# Patient Record
Sex: Female | Born: 1960 | Race: White | Hispanic: No | Smoking: Current every day smoker
Health system: Southern US, Community
[De-identification: ages and names within clinical notes are randomized; demographics above are authoritative.]

## PROBLEM LIST (undated history)

## (undated) DIAGNOSIS — M5430 Sciatica, unspecified side: Secondary | ICD-10-CM

## (undated) DIAGNOSIS — F419 Anxiety disorder, unspecified: Secondary | ICD-10-CM

## (undated) DIAGNOSIS — J9819 Other pulmonary collapse: Secondary | ICD-10-CM

## (undated) HISTORY — DX: Other pulmonary collapse: J98.19

## (undated) HISTORY — DX: Sciatica, unspecified side: M54.30

## (undated) HISTORY — DX: Anxiety disorder, unspecified: F41.9

---

## 2006-01-29 ENCOUNTER — Other Ambulatory Visit: Payer: Self-pay

## 2006-01-29 ENCOUNTER — Inpatient Hospital Stay: Payer: Self-pay | Admitting: Internal Medicine

## 2006-01-30 ENCOUNTER — Other Ambulatory Visit: Payer: Self-pay

## 2014-12-16 ENCOUNTER — Emergency Department: Payer: Self-pay | Admitting: Emergency Medicine

## 2016-01-15 ENCOUNTER — Ambulatory Visit (INDEPENDENT_AMBULATORY_CARE_PROVIDER_SITE_OTHER): Payer: Managed Care, Other (non HMO) | Admitting: Family Medicine

## 2016-01-15 ENCOUNTER — Encounter: Payer: Self-pay | Admitting: Family Medicine

## 2016-01-15 VITALS — BP 134/74 | HR 96 | Temp 98.3°F | Resp 16 | Ht 62.0 in | Wt 96.6 lb

## 2016-01-15 DIAGNOSIS — Z7189 Other specified counseling: Secondary | ICD-10-CM

## 2016-01-15 DIAGNOSIS — M5441 Lumbago with sciatica, right side: Secondary | ICD-10-CM

## 2016-01-15 DIAGNOSIS — Z7689 Persons encountering health services in other specified circumstances: Secondary | ICD-10-CM

## 2016-01-15 DIAGNOSIS — F419 Anxiety disorder, unspecified: Secondary | ICD-10-CM

## 2016-01-15 DIAGNOSIS — E785 Hyperlipidemia, unspecified: Secondary | ICD-10-CM

## 2016-01-15 DIAGNOSIS — M5440 Lumbago with sciatica, unspecified side: Secondary | ICD-10-CM | POA: Insufficient documentation

## 2016-01-15 MED ORDER — NAPROXEN 500 MG PO TABS: 500.0000 mg | ORAL_TABLET | Freq: Two times a day (BID) | ORAL | Status: DC

## 2016-01-15 MED ORDER — ALPRAZOLAM 0.5 MG PO TABS: 0.5000 mg | ORAL_TABLET | Freq: Two times a day (BID) | ORAL | Status: AC

## 2016-01-15 NOTE — Progress Notes (Signed)
Subjective:    Patient ID: Deborah Griffin, female    DOB: April 27, 1961, 55 y.o.   MRN: [id number]  HPI: Deborah Griffin is a 55 y.o. female presenting on 01/15/2016 for Establish Care   HPI  Pt presents to establish care today. Previous care provider was Dr. Paulino Rily.  It has been 3 months since Her last PCP visit. Records provided by patient and were reviewed. Current medical problems include:  Anxiety: Diagnosed 4 years ago. Takes Xanax 0.5mg  twice daily. Tried paxil in the past. She reports being unwilling to try any other medication for anxiety. No SI/HI.  Back pain: Back started 1-2 years ago. No injury no trauma. Located midline in the spine. R sided sciatica. Started last year. Diagnosed in the emergency room. Dr. Vear Clock thought it was a bulging disc but no imaging of back. Occasional flare-ups of sciatica down R leg. Pain is constant. No progressive weakness or numbness. No bowel or bladder incontinence. No saddle anesthesia.Previous physician was giving her norco and tramadol for pain.  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   Health maintenance:  Last pap smear: 16 years ago. Does not want another.  Mammogram: Unwilling. No lumps bumps or changes in her breast.  Colonoscopy: Does not want.     Past Medical History  Diagnosis Date  . Anxiety   . Sciatica   . Lung collapse    Social History   Social History  . Marital Status: Married    Spouse Name: N/A  . Number of Children: N/A  . Years of Education: N/A   Occupational History  . Not on file.   Social History Main Topics  . Smoking status: Current Every Day Smoker -- 0.50 packs/day  . Smokeless tobacco: Not on file  . Alcohol Use: No  . Drug Use: No  . Sexual Activity: Not on file   Other Topics Concern  . Not on file   Social History Narrative  . No narrative on file   Family History  Problem Relation Age of Onset  . Cancer Mother    No current outpatient prescriptions on file prior to  visit.   No current facility-administered medications on file prior to visit.    Review of Systems  Constitutional: Negative for fever and chills.  HENT: Negative.   Respiratory: Negative for cough, chest tightness and wheezing.   Cardiovascular: Negative for chest pain and leg swelling.  Gastrointestinal: Negative for nausea, vomiting, abdominal pain, diarrhea and constipation.  Endocrine: Negative.  Negative for cold intolerance, heat intolerance, polydipsia, polyphagia and polyuria.  Genitourinary: Negative for dysuria and difficulty urinating.  Musculoskeletal: Positive for back pain.  Neurological: Negative for dizziness, light-headedness and numbness.  Psychiatric/Behavioral: Negative for suicidal ideas, sleep disturbance, dysphoric mood, decreased concentration and agitation. The patient is nervous/anxious.    Per HPI unless specifically indicated above     Objective:    BP 134/74 mmHg  Pulse 96  Temp(Src) 98.3 F (36.8 C) (Oral)  Resp 16  Ht  (1.575 m)  Wt 96 lb 9.6 oz (43.817 kg)  BMI 17.66 kg/m2  LMP   Wt Readings from Last 3 Encounters:  01/15/16 96 lb 9.6 oz (43.817 kg)    Physical Exam  Constitutional: She is oriented to person, place, and time. She appears well-developed and well-nourished.  HENT:  Head: Normocephalic and atraumatic.  Neck: Neck supple.  Cardiovascular: Normal rate, regular rhythm and normal heart sounds.  Exam reveals no gallop and no friction  rub.   No murmur heard. Pulmonary/Chest: Effort normal and breath sounds normal. She has no wheezes. She exhibits no tenderness.  Abdominal: Soft. Normal appearance and bowel sounds are normal. She exhibits no distension and no mass. There is no tenderness. There is no rebound and no guarding.  Musculoskeletal: Normal range of motion. She exhibits no edema or tenderness.       Right hip: Normal.       Left hip: Normal.       Lumbar back: She exhibits normal range of motion (pain with twisting),  no tenderness, no bony tenderness, no swelling and no edema.  Straight leg raise test negative.  Full ROM in the office.   Lymphadenopathy:    She has no cervical adenopathy.  Neurological: She is alert and oriented to person, place, and time. She has normal strength. No sensory deficit. She displays a negative Romberg sign.  Reflex Scores:      Patellar reflexes are 2+ on the right side and 2+ on the left side. Skin: Skin is warm and dry.   No results found for this or any previous visit.    Assessment & Plan:   Problem List Items Addressed This Visit      Other   Anxiety - Primary    Have discussed risks of long term benzodiazipine use with patient. Have recommend that she try a long term anxiety medication such as SSRI, SNRI. Pt has declined this visit.  Have made patient aware that we will need to try a long term anxiety medication in the future due to increased risk of benzodiazipines with age and risk of tolerance Recheck 3 mos.       Relevant Medications   ALPRAZolam (XANAX) 0.5 MG tablet   Low back pain with sciatica    Exam negative for radiculopathy. Pt declined imaging of her back today. Pt is unwilling to go to chronic pain management when made aware this office does not provide longterm narcotics. She has opted to try twice daily naprosyn for pain. Encouraged back exercises daily. Suggested PT but pt declined. Back emergency symptoms reviewed.        Relevant Medications   naproxen (NAPROSYN) 500 MG tablet   Other Relevant Orders   Comprehensive metabolic panel    Other Visit Diagnoses    Mild hyperlipidemia        Relevant Orders    Lipid panel    Encounter to establish care        Pt is declining all health screening and vaccinations due to cost. She has consented to health maintenance labs. Pt provided with education on benefits of health screenings.        Meds ordered this encounter  Medications  . DISCONTD: ALPRAZolam (XANAX) 0.5 MG tablet    Sig:  Take 0.5 mg by mouth 2 (two) times daily.    Refill:  1  . DISCONTD: traMADol (ULTRAM) 50 MG tablet    Sig: Take by mouth every 6 (six) hours as needed.  . naproxen (NAPROSYN) 500 MG tablet    Sig: Take 1 tablet (500 mg total) by mouth 2 (two) times daily with a meal.    Dispense:  60 tablet    Refill:  2    Order Specific Question:  Supervising Provider    Answer:  Janeann Forehand [phone]  . ALPRAZolam (XANAX) 0.5 MG tablet    Sig: Take 1 tablet (0.5 mg total) by mouth 2 (two) times daily.  Dispense:  60 tablet    Refill:  2    Order Specific Question:  Supervising Provider    Answer:  Janeann Forehand [[id number]]      Follow up plan: Return in about 3 months (around 04/13/2016) for anxiety.

## 2016-01-15 NOTE — Assessment & Plan Note (Signed)
Exam negative for radiculopathy. Pt declined imaging of her back today. Pt is unwilling to go to chronic pain management when made aware this office does not provide longterm narcotics. She has opted to try twice daily naprosyn for pain. Encouraged back exercises daily. Suggested PT but pt declined. Back emergency symptoms reviewed.

## 2016-01-15 NOTE — Assessment & Plan Note (Addendum)
Have discussed risks of long term benzodiazipine use with patient. Have recommend that she try a long term anxiety medication such as SSRI, SNRI. Pt has declined this visit.  Have made patient aware that we will need to try a long term anxiety medication in the future due to increased risk of benzodiazipines with age and risk of tolerance Recheck 3 mos.

## 2016-01-15 NOTE — Patient Instructions (Addendum)
If you develop progressive numbness, weakness, loss of feeling in your pelvis, or loss of bowel/bladder control please seek immediate medical attention.   Back Exercises The following exercises strengthen the muscles that help to support the back. They also help to keep the lower back flexible. Doing these exercises can help to prevent back pain or lessen existing pain. If you have back pain or discomfort, try doing these exercises 2-3 times each day or as told by your health care provider. When the pain goes away, do them once each day, but increase the number of times that you repeat the steps for each exercise (do more repetitions). If you do not have back pain or discomfort, do these exercises once each day or as told by your health care provider. EXERCISES Single Knee to Chest Repeat these steps 3-5 times for each leg: 1. Lie on your back on a firm bed or the floor with your legs extended. 2. Bring one knee to your chest. Your other leg should stay extended and in contact with the floor. 3. Hold your knee in place by grabbing your knee or thigh. 4. Pull on your knee until you feel a gentle stretch in your lower back. 5. Hold the stretch for 10-30 seconds. 6. Slowly release and straighten your leg. Pelvic Tilt Repeat these steps 5-10 times: 1. Lie on your back on a firm bed or the floor with your legs extended. 2. Bend your knees so they are pointing toward the ceiling and your feet are flat on the floor. 3. Tighten your lower abdominal muscles to press your lower back against the floor. This motion will tilt your pelvis so your tailbone points up toward the ceiling instead of pointing to your feet or the floor. 4. With gentle tension and even breathing, hold this position for 5-10 seconds. Cat-Cow Repeat these steps until your lower back becomes more flexible: 1. Get into a hands-and-knees position on a firm surface. Keep your hands under your shoulders, and keep your knees under your  hips. You may place padding under your knees for comfort. 2. Let your head hang down, and point your tailbone toward the floor so your lower back becomes rounded like the back of a cat. 3. Hold this position for 5 seconds. 4. Slowly lift your head and point your tailbone up toward the ceiling so your back forms a sagging arch like the back of a cow. 5. Hold this position for 5 seconds. Press-Ups Repeat these steps 5-10 times: 1. Lie on your abdomen (face-down) on the floor. 2. Place your palms near your head, about shoulder-width apart. 3. While you keep your back as relaxed as possible and keep your hips on the floor, slowly straighten your arms to raise the top half of your body and lift your shoulders. Do not use your back muscles to raise your upper torso. You may adjust the placement of your hands to make yourself more comfortable. 4. Hold this position for 5 seconds while you keep your back relaxed. 5. Slowly return to lying flat on the floor. Bridges Repeat these steps 10 times: 1. Lie on your back on a firm surface. 2. Bend your knees so they are pointing toward the ceiling and your feet are flat on the floor. 3. Tighten your buttocks muscles and lift your buttocks off of the floor until your waist is at almost the same height as your knees. You should feel the muscles working in your buttocks and the back of your  thighs. If you do not feel these muscles, slide your feet 1-2 inches farther away from your buttocks. 4. Hold this position for 3-5 seconds. 5. Slowly lower your hips to the starting position, and allow your buttocks muscles to relax completely. If this exercise is too easy, try doing it with your arms crossed over your chest. Abdominal Crunches Repeat these steps 5-10 times: 1. Lie on your back on a firm bed or the floor with your legs extended. 2. Bend your knees so they are pointing toward the ceiling and your feet are flat on the floor. 3. Cross your arms over your  chest. 4. Tip your chin slightly toward your chest without bending your neck. 5. Tighten your abdominal muscles and slowly raise your trunk (torso) high enough to lift your shoulder blades a tiny bit off of the floor. Avoid raising your torso higher than that, because it can put too much stress on your low back and it does not help to strengthen your abdominal muscles. 6. Slowly return to your starting position. Back Lifts Repeat these steps 5-10 times: 1. Lie on your abdomen (face-down) with your arms at your sides, and rest your forehead on the floor. 2. Tighten the muscles in your legs and your buttocks. 3. Slowly lift your chest off of the floor while you keep your hips pressed to the floor. Keep the back of your head in line with the curve in your back. Your eyes should be looking at the floor. 4. Hold this position for 3-5 seconds. 5. Slowly return to your starting position. SEEK MEDICAL CARE IF:  Your back pain or discomfort gets much worse when you do an exercise.  Your back pain or discomfort does not lessen within 2 hours after you exercise. If you have any of these problems, stop doing these exercises right away. Do not do them again unless your health care provider says that you can. SEEK IMMEDIATE MEDICAL CARE IF:  You develop sudden, severe back pain. If this happens, stop doing the exercises right away. Do not do them again unless your health care provider says that you can.   This information is not intended to replace advice given to you by your health care provider. Make sure you discuss any questions you have with your health care provider.   Document Released: 12/16/2004 Document Revised: 07/30/2015 Document Reviewed: 01/02/2015 Elsevier Interactive Patient Education [employer]

## 2016-01-20 ENCOUNTER — Other Ambulatory Visit: Payer: Self-pay | Admitting: Family Medicine

## 2016-01-20 DIAGNOSIS — R634 Abnormal weight loss: Secondary | ICD-10-CM

## 2016-02-16 ENCOUNTER — Other Ambulatory Visit: Payer: Self-pay | Admitting: Family Medicine

## 2017-05-06 ENCOUNTER — Ambulatory Visit: Admit: 2017-05-06 | Payer: Self-pay | Admitting: Podiatry

## 2017-05-06 SURGERY — AMPUTATION DIGIT
Anesthesia: Monitor Anesthesia Care | Laterality: Right

## 2018-07-11 ENCOUNTER — Emergency Department
Admission: EM | Admit: 2018-07-11 | Discharge: 2018-07-11 | Disposition: A | Discharge status: Home / Self Care | Payer: 59 | Attending: Emergency Medicine | Admitting: Emergency Medicine

## 2018-07-11 ENCOUNTER — Emergency Department: Payer: 59

## 2018-07-11 DIAGNOSIS — K529 Noninfective gastroenteritis and colitis, unspecified: Secondary | ICD-10-CM | POA: Diagnosis not present

## 2018-07-11 DIAGNOSIS — Z79899 Other long term (current) drug therapy: Secondary | ICD-10-CM | POA: Diagnosis not present

## 2018-07-11 DIAGNOSIS — F17200 Nicotine dependence, unspecified, uncomplicated: Secondary | ICD-10-CM | POA: Diagnosis not present

## 2018-07-11 DIAGNOSIS — R197 Diarrhea, unspecified: Secondary | ICD-10-CM | POA: Diagnosis present

## 2018-07-11 LAB — COMPREHENSIVE METABOLIC PANEL
ALBUMIN: 3.7 g/dL (ref 3.5–5.0)
ALK PHOS: 119 U/L (ref 38–126)
ALT: 30 U/L (ref 0–44)
ANION GAP: 10 (ref 5–15)
AST: 66 U/L — ABNORMAL HIGH (ref 15–41)
BUN: 10 mg/dL (ref 6–20)
CALCIUM: 8.9 mg/dL (ref 8.9–10.3)
CHLORIDE: 108 mmol/L (ref 98–111)
CO2: 19 mmol/L — AB (ref 22–32)
Creatinine, Ser: 0.71 mg/dL (ref 0.44–1.00)
GFR calc Af Amer: >60 mL/min (ref >60)
GFR calc non Af Amer: >60 mL/min (ref >60)
GLUCOSE: 92 mg/dL (ref 70–99)
POTASSIUM: 3.9 mmol/L (ref 3.5–5.1)
SODIUM: 137 mmol/L (ref 135–145)
Total Bilirubin: 0.5 mg/dL (ref 0.3–1.2)
Total Protein: 6.7 g/dL (ref 6.5–8.1)

## 2018-07-11 LAB — T4, FREE: Free T4: 0.83 ng/dL (ref 0.82–1.77)

## 2018-07-11 LAB — CBC
HCT: 36.4 % (ref 35.0–47.0)
HEMOGLOBIN: 12.7 g/dL (ref 12.0–16.0)
MCH: 31.5 pg (ref 26.0–34.0)
MCHC: 34.8 g/dL (ref 32.0–36.0)
MCV: 90.6 fL (ref 80.0–100.0)
Platelets: 504 10*3/uL — ABNORMAL HIGH (ref 150–440)
RBC: 4.02 MIL/uL (ref 3.80–5.20)
RDW: 20.3 % — ABNORMAL HIGH (ref 11.5–14.5)
WBC: 7.3 10*3/uL (ref 3.6–11.0)

## 2018-07-11 LAB — MAGNESIUM: Magnesium: 1.6 mg/dL — ABNORMAL LOW (ref 1.7–2.4)

## 2018-07-11 LAB — TSH: TSH: 0.55 u[IU]/mL (ref 0.350–4.500)

## 2018-07-11 MED ORDER — THIAMINE HCL 100 MG/ML IJ SOLN
100.0000 mg | Freq: Once | INTRAMUSCULAR | Status: AC
  Administered 2018-07-11: 100 mg via INTRAVENOUS
  Filled 2018-07-11: qty 2

## 2018-07-11 MED ORDER — SODIUM CHLORIDE 0.9 % IV BOLUS
1000.0000 mL | Freq: Once | INTRAVENOUS | Status: AC
  Administered 2018-07-11: 1000 mL via INTRAVENOUS

## 2018-07-11 MED ORDER — SODIUM CHLORIDE 0.9 % IV SOLN
1.0000 mg | Freq: Once | INTRAVENOUS | Status: AC
  Administered 2018-07-11: 1 mg via INTRAVENOUS
  Filled 2018-07-11: qty 0.2

## 2018-07-11 NOTE — ED Provider Notes (Addendum)
[facility] Emergency Department Provider Note  ____________________________________________   I have reviewed the triage vital signs and the nursing notes. Where available I have reviewed prior notes and, if possible and indicated, outside hospital notes.    HISTORY  Chief Complaint Dehydration    HPI Deborah RogersCheryl G Sindelar is a 57 y.o. female who presents today complaining of "dehydration".  Patient states last 2 months she chronic diarrhea, nausea and decreased appetite.  She has been worked up extensively for this had a negative EGD essentially, negative CT chest and pelvis, negative CT abdomen and pelvis again, she was admitted to see for this.  She is had negative chest x-rays.  She has had decreased p.o. over the last several months but over the last couple days she has had less inclination to eat.  She states she is lost 20 pounds in the last 3 months.  She denies any melena or bright red blood per rectum.  There have not been any stool studies done on her to look for C. difficile or other pathologies of that variety however, she states that she absolutely will not give me a stool sample here because she does not feel comfortable doing it.  She denies any abdominal pain.  She is not vomiting she does not even endorse nausea, she does states she does not feel like eating.  Her PCP is referring her to heme-onc it sounds like for this condition.  Patient is very particular about what she wants done by me.  She states that she wants only to have IV fluid given.  She states that that will make her feel better and then she wants to go home she does not want to be admitted she does not want to give me a stool sample she does not want any further imaging.   Past Medical History:  Diagnosis Date  . Anxiety   . Lung collapse   . Sciatica     Patient Active Problem List   Diagnosis Date Noted  . Anxiety 01/15/2016  . Low back pain with sciatica 01/15/2016      Prior to  Admission medications   Medication Sig Start Date End Date Taking? Authorizing Provider  ALPRAZolam Prudy Feeler(XANAX) 0.5 MG tablet Take 1 tablet (0.5 mg total) by mouth 2 (two) times daily. 01/15/16  Yes Krebs, Amy Lauren, NP  escitalopram (LEXAPRO) 5 MG tablet Take 5 mg by mouth daily. 05/15/18  Yes [provider]  lisinopril (PRINIVIL,ZESTRIL) 20 MG tablet Take 20 mg by mouth daily. 06/28/18  Yes [provider]  pantoprazole (PROTONIX) 40 MG tablet Take 40 mg by mouth 2 (two) times daily. 07/02/18  Yes [provider]  traMADol (ULTRAM) 50 MG tablet Take 50 mg by mouth 2 (two) times daily. 07/03/18  Yes [provider]  naproxen (NAPROSYN) 500 MG tablet Take 1 tablet (500 mg total) by mouth 2 (two) times daily with a meal. Patient not taking: Reported on 07/11/2018 01/15/16   Loura PardonKrebs, Amy Lauren, NP    Allergies Penicillins  Family History  Problem Relation Age of Onset  . Cancer Mother     Social History Social History   Tobacco Use  . Smoking status: Current Every Day Smoker    Packs/day: 0.50  Substance Use Topics  . Alcohol use: No  . Drug use: No    Review of Systems Constitutional: No fever/chills Eyes: No visual changes. ENT: No sore throat. No stiff neck no neck pain Cardiovascular: Denies chest pain.  Respiratory: Denies shortness of breath. Gastrointestinal:   no vomiting.  Positive continual diarrhea.  No constipation. Genitourinary: Negative for dysuria. Musculoskeletal: Negative lower extremity swelling Skin: Negative for rash. Neurological: Negative for severe headaches, focal weakness or numbness.   ____________________________________________   PHYSICAL EXAM:  VITAL SIGNS: ED Triage Vitals  Enc Vitals Group     BP 07/11/18 1141 100/68     Pulse Rate 07/11/18 1141 99     Resp 07/11/18 1141 15     Temp 07/11/18 1141 98.1 F (36.7 C)     Temp Source 07/11/18 1141 Oral     SpO2 07/11/18 1141 98 %     Weight 07/11/18 1138 80 lb  (36.3 kg)     Height 07/11/18 1138 5\' 2"  (1.575 m)     Head Circumference --      Peak Flow --      Pain Score --      Pain Loc --      Pain Edu? --      Excl. in GC? --     Constitutional: Alert and oriented. Well appearing and in no acute distress.  Patient is somewhat cachectic however Eyes: Conjunctivae are normal Head: Atraumatic HEENT: No congestion/rhinnorhea. Mucous membranes are moist.  Oropharynx non-erythematous Neck:   Nontender with no meningismus, no masses, no stridor Cardiovascular: Normal rate, regular rhythm. Grossly normal heart sounds.  Good peripheral circulation. Respiratory: Normal respiratory effort.  No retractions. Lungs CTAB. Abdominal: Soft and nontender. No distention. No guarding no rebound Back:  There is no focal tenderness or step off.  there is no midline tenderness there are no lesions noted. there is no CVA tenderness Musculoskeletal: No lower extremity tenderness, no upper extremity tenderness. No joint effusions, no DVT signs strong distal pulses no edema Neurologic:  Normal speech and language. No gross focal neurologic deficits are appreciated.  Skin:  Skin is warm, dry and intact. No rash noted. Psychiatric: Mood and affect are normal. Speech and behavior are normal.  ____________________________________________   LABS (all labs ordered are listed, but only abnormal results are displayed)  Labs Reviewed  CBC - Abnormal; Notable for the following components:      Result Value   RDW 20.3 (*)    Platelets 504 (*)    All other components within normal limits  COMPREHENSIVE METABOLIC PANEL - Abnormal; Notable for the following components:   CO2 19 (*)    AST 66 (*)    All other components within normal limits  MAGNESIUM    Pertinent labs  results that were available during my care of the patient were reviewed by me and considered in my medical decision making (see chart for  details). ____________________________________________  EKG  I personally interpreted any EKGs ordered by me or triage  ____________________________________________  RADIOLOGY  Pertinent labs & imaging results that were available during my care of the patient were reviewed by me and considered in my medical decision making (see chart for details). If possible, patient and/or family made aware of any abnormal findings.  No results found. ____________________________________________    PROCEDURES  Procedure(s) performed: None  Procedures  Critical Care performed: None  ____________________________________________   INITIAL IMPRESSION / ASSESSMENT AND PLAN / ED COURSE  Pertinent labs & imaging results that were available during my care of the patient were reviewed by me and considered in my medical decision making (see chart for details).  Patient here complaining of feeling dehydrated or these being told that she was dehydrated.  There was some confusion about whether labs were drawn at her primary care's office, if they were they are not available to us.  In any event we did recheck them, BUN/creatinine and blood work otherwise reassuring.  It extensively explained to her that we are happy to try to assist in figuring out why she has been so ill for the last several months even though she has had extensive imaging, and endoscopy, she likely requires a colonoscopy and stool samples be sent.  She states her doctor sent her home with something to collect stool but she has not done it yet.  It was just sent home with her yesterday.  She does not want a stool collection here, and she refuses any further imaging for me, I am not sure if there would be utility in a CT scan in any event because the patient has had negative CT abdomen x2 during the course of this disease process as well as chest x-rays and CT scans of the chest that are negative.  I will do screening chest x-ray is sometimes  oncologic processes, which would be the major concern, can present in a somewhat delayed fashion.  We will ourselves ensure that she is referred to heme-onc, given this 20 pound weight loss and GI for colonoscopy which I do not see that she is had.  We will give her IV fluid, will check a thyroid test, [facility] found to be somewhat hyperthyroid,  ----------------------------------------- 5:30 PM on 07/11/2018 -----------------------------------------  Test this far reassuring, patient insisting on discharge at this time has received IV fluid is tolerating well.  She will follow closely with primary care doctor.  She declines any further intervention or work-up for me here.    ____________________________________________   FINAL CLINICAL IMPRESSION(S) / ED DIAGNOSES  Final diagnoses:  None      This chart was dictated using voice recognition software.  Despite best efforts to proofread,  errors can occur which can change meaning.      Jeanmarie PlantMcShane, Xayden Linsey A, MD 07/11/18 1606    Jeanmarie PlantMcShane, Kristilyn Coltrane A, MD 07/11/18 [phone]

## 2018-07-11 NOTE — ED Notes (Signed)
This RN to speak with pt. Pt upset that she has to get labs drawn and she has already been here for several hours. I was very apologetic to the pt. We discussed the labs she had drawn at her MD office this am and that we were waiting for results. When no results were coming through we checked with MD office and realized that no labs were sent for analysis. Pt agreeable to let EDT get labs.

## 2018-07-11 NOTE — ED Triage Notes (Signed)
Pt states she is here sent by PCP for dehydration Dr. Lajoyce CornersLindly. Pt is NAD.

## 2018-07-11 NOTE — ED Notes (Signed)
Pt going back and forth asking "what is the deal? My doctor called and said I was going to get an IV." This tech informed pt that it does not happen like that. Pt keeps telling spouse that it is not worth staying but spouse insist on staying. Pt is talking to spouse stating "I can not wait another three hours in the lobby. Dr.Khan said they will go on and take me in. I don't think it is worth it, I think we should just leave. I am not paying them for me to just sit here all day long."

## 2018-07-11 NOTE — ED Notes (Signed)
Pt ambulated with steady gait out of tx area. ABCs intact. NAD

## 2018-07-11 NOTE — ED Triage Notes (Signed)
Labs were not drawn as pt just had them drawn at PCP. This morning

## 2018-07-11 NOTE — ED Notes (Signed)
McShane MD at bedside. 

## 2018-07-11 NOTE — ED Triage Notes (Signed)
Pt had labs drawn at PCP and then sent here

## 2018-07-12 LAB — T3: T3 TOTAL: 77 ng/dL (ref 71–180)

## 2018-07-12 LAB — T4: T4, Total: 5.6 ug/dL (ref 4.5–12.0)

## 2018-08-09 ENCOUNTER — Inpatient Hospital Stay: Payer: Self-pay | Attending: Hematology and Oncology | Admitting: Hematology and Oncology

## 2018-08-09 DIAGNOSIS — R778 Other specified abnormalities of plasma proteins: Secondary | ICD-10-CM | POA: Insufficient documentation

## 2018-08-09 DIAGNOSIS — R634 Abnormal weight loss: Secondary | ICD-10-CM | POA: Insufficient documentation

## 2018-08-09 NOTE — Progress Notes (Deleted)
[facility] day:  08/09/2018  Chief Complaint: Deborah Griffin is a 57 y.o. female with unexplained weight loss who is referred in consultation by Dr. Charlotte Crumb for assessment and management.  HPI:  The patient was admitted to [facility] from 06/02/2018 - 06/06/2018 with intractable nausea and vomiting. She underwent an EGD. EGD on 06/05/2018 revealed mild esophagitis (grade B), widely patent Schatzki ring in distal esophagus, erythematous pre-pyloric region, patent pylorus, and duodenitis with erythema and small non bleeding erosions. Biopsy was negative for H. Pylori. At the time, it was thought that the symptoms may be related to functional nausea/vomiting. It was recommended that the patient continue PPI BID for 8 weeks, limit narcotic use, and discontinue NSAIDs.   CT Abdomen and Pelvis on 06/03/2018 revealed no intra-abdominal pathology.  CTA Chest (04/25/2018), CXR (06/03/2018), and RUQ ultrasound (04/25/2018) at [facility] have all been unremarkable.   She was seen in the [facility] ER on 07/11/2018 chronic diarrhea, nausea, and decreased appetite.  She had lost 20 pounds in 3 months.  She declined stool studies.  She only requested IVF for dehydration.  She was referred to outpatient GI and heme-onc.   CBC revealed a hematocrit of 36.4, hemoglobin 12.7, MCV 90.6, platelets 504,000, WBC 7300.  TSH was 0.550 with a free T4 of 0.83.  CXR revealed hyperinflation/emphysema and no acute abnormality.  The patient was admitted to [facility] from 07/17/2018 - 07/19/2018.  She left AMA.  She presented with intractable nausea, vomiting, and intermittent diarrhea. The patient was admitted at [facility] 07/12 - 06/06/2018 for similar symptoms. She described nausea and intermittent burning belly pain. Symptoms began approximately 3 months ago.  Prior to that, the patient was eating and drinking normally.   Symptoms were of sudden onset with intermittent exacerbations. The intensity of her  nausea waxes/wanes. Since the nausea has begun, she has rarely has had a full meal. Whenever she eats or drinks, the patient feels nauseous soon after ingestion, leading to vomiting of the solid/liquid. Because of her inability to eat, she has also had complaints of weight loss from 98 lbs to 79 lbs (~20lb loss) since symptoms began 3 months ago.   She reports that she has some difficult with swallowing where solids and liquids feel like they are getting stuck, which occurs 3-4x per day. Sometimes she has pain with swallowing. The patient does not feel that anything has specifically helped with her symptoms other than IV anti-nausea medications. Alprazolam sometimes helps make her symptoms tolerable. When she has exacerbation of her nausea/vomiting symptoms, she also notices some diarrhea. She denies any blood in her stool or emesis. The patient does not smoke marijuana. Family history negative for GI cancer. She is a current smoker.  She was treated with supportive care and symptom management, including IV fluids, antiemetics, Pantoprazole (hx gastritis). She had mild hypotension one night which resolved with IV fluids and lisinopril discontinued. She had some nausea but no vomiting and about two stools during admission. GI recommended some stool studies, anemia evaluation, strict calorie counting, and consider trial of reglan. Nutrition consulted and met criteria for severe malnutrition. Patient able to tolerate some po without emesis but had minimal po intake and did not attempt supplements. Na, K, Mg, phos normal on 07/20/2018.   CBC revealed a hematocrit of 32.2, hemoglobin 10.4, MCV 94.1, platelets 566,000, and WBC 8100.  SPEP revealed an irregularity in the gamma region which may represent a monoclonal protein.  Kappa free  light chains were 0.99, lambda free light chains were 1.45 with a ratio of 0.68 (normal).  B12, folate were normal. C diff, GI panel, and HIV negative. TSH was 0.136 (0.6 - 3.3) with  a free T4 of 0.92 (0.71 - 1.4) and free T3 2.75 (2.71 - 6.16).  Per GI, differential includes functional nausea/vomiting, gastroparesis, cyclic vomiting syndrome, and anorexia. On 07/20/2018, medications transitioned to PO formulations and she was encouraged to try more po and supplements. Plannned monitor her po intake, calorie counts and electrolytes to assess intake and potential risk for refeeding syndrome. Patient requested to leave Against Medical Advice. She was to follow-up with PCP on 08/03/2018 and GI at [facility].    Past Medical History:  Diagnosis Date  . Anxiety   . Lung collapse   . Sciatica     *** The histories are not reviewed yet. Please review them in the "History" navigator section and refresh this [place].  Family History  Problem Relation Age of Onset  . Cancer Mother     Social History:  reports that she has been smoking. She has been smoking about 0.50 packs per day. She does not have any smokeless tobacco history on file. She reports that she does not drink alcohol or use drugs.  The patient is accompanied by *** alone today.  Allergies:  Allergies  Allergen Reactions  . Penicillins     Current Medications: Current Outpatient Medications  Medication Sig Dispense Refill  . ALPRAZolam (XANAX) 0.5 MG tablet Take 1 tablet (0.5 mg total) by mouth 2 (two) times daily. 60 tablet 2  . escitalopram (LEXAPRO) 5 MG tablet Take 5 mg by mouth daily.  2  . lisinopril (PRINIVIL,ZESTRIL) 20 MG tablet Take 20 mg by mouth daily.  1  . naproxen (NAPROSYN) 500 MG tablet Take 1 tablet (500 mg total) by mouth 2 (two) times daily with a meal. (Patient not taking: Reported on 07/11/2018) 60 tablet 2  . pantoprazole (PROTONIX) 40 MG tablet Take 40 mg by mouth 2 (two) times daily.  1  . traMADol (ULTRAM) 50 MG tablet Take 50 mg by mouth 2 (two) times daily.  2   No current facility-administered medications for this visit.     Review of Systems:  GENERAL:  Feels good.   Active.  No fevers, sweats or weight loss. PERFORMANCE STATUS (ECOG):  *** HEENT:  No visual changes, runny nose, sore throat, mouth sores or tenderness. Lungs: No shortness of breath or cough.  No hemoptysis. Cardiac:  No chest pain, palpitations, orthopnea, or PND. GI:  No nausea, vomiting, diarrhea, constipation, melena or hematochezia. GU:  No urgency, frequency, dysuria, or hematuria. Musculoskeletal:  No back pain.  No joint pain.  No muscle tenderness. Extremities:  No pain or swelling. Skin:  No rashes or skin changes. Neuro:  No headache, numbness or weakness, balance or coordination issues. Endocrine:  No diabetes, thyroid issues, hot flashes or night sweats. Psych:  No mood changes, depression or anxiety. Pain:  No focal pain. Review of systems:  All other systems reviewed and found to be negative.  Physical Exam: There were no vitals taken for this visit. GENERAL:  Well developed, well nourished, **man sitting comfortably in the exam room in no acute distress. MENTAL STATUS:  Alert and oriented to person, place and time. HEAD:  *** hair.  Normocephalic, atraumatic, face symmetric, no Cushingoid features. EYES:  *** eyes.  Pupils equal round and reactive to light and accomodation.  No conjunctivitis or  scleral icterus. ENT:  Oropharynx clear without lesion.  Tongue normal. Mucous membranes moist.  RESPIRATORY:  Clear to auscultation without rales, wheezes or rhonchi. CARDIOVASCULAR:  Regular rate and rhythm without murmur, rub or gallop. ABDOMEN:  Soft, non-tender, with active bowel sounds, and no hepatosplenomegaly.  No masses. SKIN:  No rashes, ulcers or lesions. EXTREMITIES: No edema, no skin discoloration or tenderness.  No palpable cords. LYMPH NODES: No palpable cervical, supraclavicular, axillary or inguinal adenopathy  NEUROLOGICAL: Unremarkable. PSYCH:  Appropriate.   No visits with results within 3 Day(s) from this visit.  Latest known visit with results is:   Admission on 07/11/2018, Discharged on 07/11/2018  Component Date Value Ref Range Status  . WBC 07/11/2018 7.3  3.6 - 11.0 K/uL Final  . RBC 07/11/2018 4.02  3.80 - 5.20 MIL/uL Final  . Hemoglobin 07/11/2018 12.7  12.0 - 16.0 g/dL Final  . HCT 07/11/2018 36.4  35.0 - 47.0 % Final  . MCV 07/11/2018 90.6  80.0 - 100.0 fL Final  . MCH 07/11/2018 31.5  26.0 - 34.0 pg Final  . MCHC 07/11/2018 34.8  32.0 - 36.0 g/dL Final  . RDW 07/11/2018 20.3* 11.5 - 14.5 % Final  . Platelets 07/11/2018 504* 150 - 440 K/uL Final   Performed at [facility], [address] [place], [place] [address]  . Sodium 07/11/2018 137  135 - 145 mmol/L Final  . Potassium 07/11/2018 3.9  3.5 - 5.1 mmol/L Final  . Chloride 07/11/2018 108  98 - 111 mmol/L Final  . CO2 07/11/2018 19* 22 - 32 mmol/L Final  . Glucose, Bld 07/11/2018 92  70 - 99 mg/dL Final  . BUN 07/11/2018 10  6 - 20 mg/dL Final  . Creatinine, Ser 07/11/2018 0.71  0.44 - 1.00 mg/dL Final  . Calcium 07/11/2018 8.9  8.9 - 10.3 mg/dL Final  . Total Protein 07/11/2018 6.7  6.5 - 8.1 g/dL Final  . Albumin 07/11/2018 3.7  3.5 - 5.0 g/dL Final  . AST 07/11/2018 66* 15 - 41 U/L Final  . ALT 07/11/2018 30  0 - 44 U/L Final  . Alkaline Phosphatase 07/11/2018 119  38 - 126 U/L Final  . Total Bilirubin 07/11/2018 0.5  0.3 - 1.2 mg/dL Final  . GFR calc non Af Amer 07/11/2018 >60  >60 mL/min Final  . GFR calc Af Amer 07/11/2018 >60  >60 mL/min Final   Comment: (NOTE) The eGFR has been calculated using the CKD EPI equation. This calculation has not been validated in all clinical situations. eGFR's persistently <60 mL/min signify possible Chronic Kidney Disease.   Georgiann Hahn gap 07/11/2018 10  5 - 15 Final   Performed at [facility], [address] [place], [place] [address]  . Magnesium 07/11/2018 1.6* 1.7 - 2.4 mg/dL Final   Performed at [facility], [address] [place], [place] [address]  . TSH 07/11/2018 0.550  0.350 - 4.500  uIU/mL Final   Comment: Performed by a 3rd Generation assay with a functional sensitivity of <=0.01 uIU/mL. Performed at [facility], [address] [place], [place] [address]   . T4, Total 07/11/2018 5.6  4.5 - 12.0 ug/dL Final   Comment: (NOTE) Performed At: [facility] [place], [place] [id number] Rush Farmer MD [id number]   . Free T4 07/11/2018 0.83  0.82 - 1.77 ng/dL Final   Comment: (NOTE) Biotin ingestion may interfere with free T4 tests. If the results are inconsistent with the TSH  level, previous test results, or the clinical presentation, then consider biotin interference. If needed, order repeat testing after stopping biotin. Performed at [facility], [address] [place], [place] [address]   . T3, Total 07/11/2018 77  71 - 180 ng/dL Final   Comment: (NOTE) Performed At: [facility] [address] [place], [place] [id number] Rush Farmer MD [id number]     Assessment:  KARISHA MARLIN is a 57 y.o. female ***  Plan: 1.   2.   3.   4.    Lequita Asal, MD  08/09/2018, 3:18 AM   I saw and evaluated the patient, participating in the key portions of the service and reviewing pertinent diagnostic studies and records.  I reviewed the nurse practitioner's note and agree with the findings and the plan.  The assessment and plan were discussed with the patient.  Additional diagnostic studies of *** are needed to clarify *** and would change the clinical management.  A few ***multiple questions were asked by the patient and answered.   Nolon Stalls, MD 08/09/2018,3:18 AM

## 2020-01-08 ENCOUNTER — Inpatient Hospital Stay
Admission: EM | Admit: 2020-01-08 | Discharge: 2020-01-13 | DRG: 392 | Disposition: A | Discharge status: Home / Self Care | Payer: Self-pay | Attending: Internal Medicine | Admitting: Internal Medicine

## 2020-01-08 ENCOUNTER — Emergency Department: Payer: Self-pay

## 2020-01-08 ENCOUNTER — Encounter: Payer: Self-pay | Admitting: Emergency Medicine

## 2020-01-08 ENCOUNTER — Other Ambulatory Visit: Payer: Self-pay

## 2020-01-08 DIAGNOSIS — Z681 Body mass index (BMI) 19 or less, adult: Secondary | ICD-10-CM

## 2020-01-08 DIAGNOSIS — R7989 Other specified abnormal findings of blood chemistry: Secondary | ICD-10-CM | POA: Diagnosis present

## 2020-01-08 DIAGNOSIS — R636 Underweight: Secondary | ICD-10-CM | POA: Diagnosis present

## 2020-01-08 DIAGNOSIS — Z20822 Contact with and (suspected) exposure to covid-19: Secondary | ICD-10-CM | POA: Diagnosis present

## 2020-01-08 DIAGNOSIS — K2100 Gastro-esophageal reflux disease with esophagitis, without bleeding: Secondary | ICD-10-CM | POA: Diagnosis present

## 2020-01-08 DIAGNOSIS — M5440 Lumbago with sciatica, unspecified side: Secondary | ICD-10-CM | POA: Diagnosis present

## 2020-01-08 DIAGNOSIS — E871 Hypo-osmolality and hyponatremia: Secondary | ICD-10-CM | POA: Diagnosis present

## 2020-01-08 DIAGNOSIS — K529 Noninfective gastroenteritis and colitis, unspecified: Principal | ICD-10-CM | POA: Diagnosis present

## 2020-01-08 DIAGNOSIS — K8020 Calculus of gallbladder without cholecystitis without obstruction: Secondary | ICD-10-CM | POA: Diagnosis present

## 2020-01-08 DIAGNOSIS — G894 Chronic pain syndrome: Secondary | ICD-10-CM | POA: Diagnosis present

## 2020-01-08 DIAGNOSIS — F17210 Nicotine dependence, cigarettes, uncomplicated: Secondary | ICD-10-CM | POA: Diagnosis present

## 2020-01-08 DIAGNOSIS — R1011 Right upper quadrant pain: Secondary | ICD-10-CM

## 2020-01-08 DIAGNOSIS — K219 Gastro-esophageal reflux disease without esophagitis: Secondary | ICD-10-CM | POA: Diagnosis present

## 2020-01-08 DIAGNOSIS — Z809 Family history of malignant neoplasm, unspecified: Secondary | ICD-10-CM

## 2020-01-08 DIAGNOSIS — K30 Functional dyspepsia: Secondary | ICD-10-CM | POA: Diagnosis present

## 2020-01-08 DIAGNOSIS — R112 Nausea with vomiting, unspecified: Secondary | ICD-10-CM

## 2020-01-08 DIAGNOSIS — I10 Essential (primary) hypertension: Secondary | ICD-10-CM | POA: Diagnosis present

## 2020-01-08 DIAGNOSIS — K222 Esophageal obstruction: Secondary | ICD-10-CM | POA: Diagnosis present

## 2020-01-08 DIAGNOSIS — K2980 Duodenitis without bleeding: Secondary | ICD-10-CM | POA: Diagnosis present

## 2020-01-08 DIAGNOSIS — Z79899 Other long term (current) drug therapy: Secondary | ICD-10-CM

## 2020-01-08 DIAGNOSIS — R64 Cachexia: Secondary | ICD-10-CM | POA: Diagnosis present

## 2020-01-08 DIAGNOSIS — E876 Hypokalemia: Secondary | ICD-10-CM | POA: Diagnosis present

## 2020-01-08 DIAGNOSIS — Z560 Unemployment, unspecified: Secondary | ICD-10-CM

## 2020-01-08 DIAGNOSIS — F419 Anxiety disorder, unspecified: Secondary | ICD-10-CM | POA: Diagnosis present

## 2020-01-08 LAB — CBC WITH DIFFERENTIAL/PLATELET
Abs Immature Granulocytes: 0.04 10*3/uL (ref 0.00–0.07)
Basophils Absolute: 0 10*3/uL (ref 0.0–0.1)
Basophils Relative: 0 %
Eosinophils Absolute: 0 10*3/uL (ref 0.0–0.5)
Eosinophils Relative: 0 %
HCT: 36.6 % (ref 36.0–46.0)
Hemoglobin: 12.4 g/dL (ref 12.0–15.0)
Immature Granulocytes: 0 %
Lymphocytes Relative: 19 %
Lymphs Abs: 1.7 10*3/uL (ref 0.7–4.0)
MCH: 27.9 pg (ref 26.0–34.0)
MCHC: 33.9 g/dL (ref 30.0–36.0)
MCV: 82.2 fL (ref 80.0–100.0)
Monocytes Absolute: 0.4 10*3/uL (ref 0.1–1.0)
Monocytes Relative: 4 %
Neutro Abs: 7.1 10*3/uL (ref 1.7–7.7)
Neutrophils Relative %: 77 %
Platelets: 719 10*3/uL — ABNORMAL HIGH (ref 150–400)
RBC: 4.45 MIL/uL (ref 3.87–5.11)
RDW: 17.7 % — ABNORMAL HIGH (ref 11.5–15.5)
WBC: 9.2 10*3/uL (ref 4.0–10.5)
nRBC: 0 % (ref 0.0–0.2)

## 2020-01-08 LAB — COMPREHENSIVE METABOLIC PANEL
ALT: 9 U/L (ref 0–44)
AST: 18 U/L (ref 15–41)
Albumin: 3.9 g/dL (ref 3.5–5.0)
Alkaline Phosphatase: 118 U/L (ref 38–126)
Anion gap: 13 (ref 5–15)
BUN: 6 mg/dL (ref 6–20)
CO2: 20 mmol/L — ABNORMAL LOW (ref 22–32)
Calcium: 9.4 mg/dL (ref 8.9–10.3)
Chloride: 103 mmol/L (ref 98–111)
Creatinine, Ser: 0.57 mg/dL (ref 0.44–1.00)
GFR calc Af Amer: >60 mL/min (ref >60)
GFR calc non Af Amer: >60 mL/min (ref >60)
Glucose, Bld: 178 mg/dL — ABNORMAL HIGH (ref 70–99)
Potassium: 3.5 mmol/L (ref 3.5–5.1)
Sodium: 136 mmol/L (ref 135–145)
Total Bilirubin: 0.7 mg/dL (ref 0.3–1.2)
Total Protein: 7.1 g/dL (ref 6.5–8.1)

## 2020-01-08 LAB — LACTIC ACID, PLASMA: Lactic Acid, Venous: 1.7 mmol/L (ref 0.5–1.9)

## 2020-01-08 LAB — TROPONIN I (HIGH SENSITIVITY): Troponin I (High Sensitivity): 4 ng/L (ref <18)

## 2020-01-08 LAB — LIPASE, BLOOD: Lipase: 33 U/L (ref 11–51)

## 2020-01-08 MED ORDER — LIDOCAINE VISCOUS HCL 2 % MT SOLN
15.0000 mL | Freq: Once | OROMUCOSAL | Status: AC
  Administered 2020-01-08: 15 mL via ORAL
  Filled 2020-01-08: qty 15

## 2020-01-08 MED ORDER — LACTATED RINGERS IV BOLUS
1000.0000 mL | Freq: Once | INTRAVENOUS | Status: AC
  Administered 2020-01-08: 1000 mL via INTRAVENOUS

## 2020-01-08 MED ORDER — HYDROMORPHONE HCL 1 MG/ML IJ SOLN
0.5000 mg | INTRAMUSCULAR | Status: DC | PRN
  Administered 2020-01-08: 0.5 mg via INTRAVENOUS
  Filled 2020-01-08: qty 1

## 2020-01-08 MED ORDER — IOHEXOL 300 MG/ML  SOLN
75.0000 mL | Freq: Once | INTRAMUSCULAR | Status: AC | PRN
  Administered 2020-01-08: 75 mL via INTRAVENOUS

## 2020-01-08 MED ORDER — HALOPERIDOL LACTATE 5 MG/ML IJ SOLN
2.0000 mg | Freq: Once | INTRAMUSCULAR | Status: AC
  Administered 2020-01-08: 2 mg via INTRAVENOUS
  Filled 2020-01-08: qty 1

## 2020-01-08 MED ORDER — ALUM & MAG HYDROXIDE-SIMETH 200-200-20 MG/5ML PO SUSP
30.0000 mL | Freq: Once | ORAL | Status: AC
  Administered 2020-01-08: 30 mL via ORAL
  Filled 2020-01-08: qty 30

## 2020-01-08 MED ORDER — HYDROMORPHONE HCL 1 MG/ML IJ SOLN: 0.5000 mg | INTRAMUSCULAR | Status: DC | PRN

## 2020-01-08 MED ORDER — ONDANSETRON HCL 4 MG/2ML IJ SOLN
4.0000 mg | Freq: Once | INTRAMUSCULAR | Status: AC
  Administered 2020-01-08: 4 mg via INTRAVENOUS
  Filled 2020-01-08: qty 2

## 2020-01-08 MED ORDER — PANTOPRAZOLE SODIUM 40 MG IV SOLR
40.0000 mg | Freq: Once | INTRAVENOUS | Status: AC
  Administered 2020-01-08: 40 mg via INTRAVENOUS
  Filled 2020-01-08: qty 40

## 2020-01-08 NOTE — ED Provider Notes (Signed)
[facility] Emergency Department Provider Note  ____________________________________________   First MD Initiated Contact with Patient 01/08/20 2106     (approximate)  I have reviewed the triage vital signs and the nursing notes.   HISTORY  Chief Complaint Abdominal Pain    HPI Deborah Griffin is a 59 y.o. female with anxiety, who comes in for severe abdominal pain.  Patient stated that she had sudden onset severe upper abdominal pain that started around 4 PM, constant, nothing made it better, nothing made it worse.  Associated with some nausea and vomiting.  States that she is had this previously and has been due to her gallbladder.  Gallbladder is never been removed.  Denies any fevers.  Denies any chest pain or shortness of breath.            Past Medical History:  Diagnosis Date  . Anxiety   . Lung collapse   . Sciatica     Patient Active Problem List   Diagnosis Date Noted  . Weight loss 08/09/2018  . Abnormal SPEP 08/09/2018  . Anxiety 01/15/2016  . Low back pain with sciatica 01/15/2016    Prior to Admission medications   Medication Sig Start Date End Date Taking? Authorizing Provider  ALPRAZolam Prudy Feeler) 0.5 MG tablet Take 1 tablet (0.5 mg total) by mouth 2 (two) times daily. 01/15/16   Krebs, Laurel Dimmer, NP  escitalopram (LEXAPRO) 5 MG tablet Take 5 mg by mouth daily. 05/15/18   [provider]  lisinopril (PRINIVIL,ZESTRIL) 20 MG tablet Take 20 mg by mouth daily. 06/28/18   [provider]  naproxen (NAPROSYN) 500 MG tablet Take 1 tablet (500 mg total) by mouth 2 (two) times daily with a meal. Patient not taking: Reported on 07/11/2018 01/15/16   Loura Pardon, NP  pantoprazole (PROTONIX) 40 MG tablet Take 40 mg by mouth 2 (two) times daily. 07/02/18   [provider]  traMADol (ULTRAM) 50 MG tablet Take 50 mg by mouth 2 (two) times daily. 07/03/18   [provider]     Allergies Penicillins  Family History  Problem Relation Age of Onset  . Cancer Mother     Social History Social History   Tobacco Use  . Smoking status: Current Every Day Smoker    Packs/day: 0.50  Substance Use Topics  . Alcohol use: No  . Drug use: No      Review of Systems Constitutional: No fever/chills Eyes: No visual changes. ENT: No sore throat. Cardiovascular: Denies chest pain. Respiratory: Denies shortness of breath. Gastrointestinal: Positive abdominal pain and vomiting no diarrhea.  No constipation. Genitourinary: Negative for dysuria. Musculoskeletal: Negative for back pain. Skin: Negative for rash. Neurological: Negative for headaches, focal weakness or numbness. All other ROS negative ____________________________________________   PHYSICAL EXAM:  VITAL SIGNS: Height 5\' 2"  (1.575 m), weight 45.4 kg, SpO2 100 %.   Constitutional: Alert and oriented.  Appears in discomfort Eyes: Conjunctivae are normal. EOMI. Head: Atraumatic. Nose: No congestion/rhinnorhea. Mouth/Throat: Mucous membranes are moist.   Neck: No stridor. Trachea Midline. FROM Cardiovascular: Normal rate, regular rhythm. Grossly normal heart sounds.  Good peripheral circulation. Respiratory: Normal respiratory effort.  No retractions. Lungs CTAB. Gastrointestinal: Soft but tenderness in the upper abdomen.  No distention. No abdominal bruits.  Musculoskeletal: No lower extremity tenderness nor edema.  No joint effusions. Neurologic:  Normal speech and language. No gross focal neurologic deficits are appreciated.  Skin:  Skin is warm, dry and intact. No rash noted.  Psychiatric: Mood and affect are normal. Speech and behavior are normal. GU: Deferred   ____________________________________________   LABS (all labs ordered are listed, but only abnormal results are displayed)  Labs Reviewed  CBC WITH DIFFERENTIAL/PLATELET - Abnormal; Notable for the following components:       Result Value   RDW 17.7 (*)    Platelets 719 (*)    All other components within normal limits  COMPREHENSIVE METABOLIC PANEL  LACTIC ACID, PLASMA  LACTIC ACID, PLASMA  LIPASE, BLOOD  URINALYSIS, ROUTINE W REFLEX MICROSCOPIC  TROPONIN I (HIGH SENSITIVITY)   ____________________________________________   ED ECG REPORT I, Concha Se, the attending physician, personally viewed and interpreted this ECG.  EKG is sinus rate of 98, no ST elevation, T wave inversions in 2 3 aVF, normal intervals.  Patient has no recent EKG to compare to ____________________________________________  RADIOLOGY  Official radiology report(s): CT ABDOMEN PELVIS W CONTRAST  Addendum Date: 01/08/2020   ADDENDUM REPORT: 01/08/2020 23:13 ADDENDUM: Mild emphysema at the lung bases. Electronically Signed   By: Jasmine Pang M.D.   On: 01/08/2020 23:13   Result Date: 01/08/2020 CLINICAL DATA:  Right upper quadrant pain EXAM: CT ABDOMEN AND PELVIS WITH CONTRAST TECHNIQUE: Multidetector CT imaging of the abdomen and pelvis was performed using the standard protocol following bolus administration of intravenous contrast. CONTRAST:  32mL OMNIPAQUE IOHEXOL 300 MG/ML  SOLN COMPARISON:  CT 01/29/2006 FINDINGS: Lower chest: No acute abnormality. Hepatobiliary: No focal liver abnormality is seen. No gallbladder wall thickening or biliary dilatation. Possible small stones at the gallbladder fundus. Pancreas: Unremarkable. No pancreatic ductal dilatation or surrounding inflammatory changes. Spleen: Normal in size without focal abnormality. Adrenals/Urinary Tract: Adrenal glands are unremarkable. Kidneys are normal, without renal calculi, focal lesion, or hydronephrosis. Bladder is unremarkable. Stomach/Bowel: The stomach is nonenlarged. No dilated small bowel. Possible diffuse colon wall thickening. Negative appendix. Vascular/Lymphatic: Moderate aortic atherosclerosis without aneurysm. No significantly enlarged lymph nodes  Reproductive: Uterus and bilateral adnexa are unremarkable. Other: Negative for free air or free fluid Musculoskeletal: No acute or significant osseous findings. IMPRESSION: 1. Collapsed appearance versus diffuse colitis type changes of the colon. 2. Possible small stones at the gallbladder fundus. 3. Otherwise no CT evidence for acute intra-abdominal or pelvic abnormality. Electronically Signed: By: Jasmine Pang M.D. On: 01/08/2020 22:40   US ABDOMEN LIMITED RUQ  Result Date: 01/08/2020 CLINICAL DATA:  Right upper quadrant abdominal pain. EXAM: ULTRASOUND ABDOMEN LIMITED RIGHT UPPER QUADRANT COMPARISON:  CT from same day. FINDINGS: Gallbladder: No gallstones or wall thickening visualized. No sonographic Murphy sign noted by sonographer. Common bile duct: Diameter: 2 mm Liver: No focal lesion identified. Within normal limits in parenchymal echogenicity. Portal vein is patent on color Doppler imaging with normal direction of blood flow towards the liver. Other: None. IMPRESSION: No abnormality detected. No explanation for the patient's right upper quadrant abdominal pain. Electronically Signed   By: Katherine Mantle M.D.   On: 01/08/2020 22:40    ____________________________________________   PROCEDURES  Procedure(s) performed (including Critical Care):  Procedures   ____________________________________________   INITIAL IMPRESSION / ASSESSMENT AND PLAN / ED COURSE  NEIL ERRICKSON was evaluated in Emergency Department on 01/08/2020 for the symptoms described in the history of present illness. She was evaluated in the context of the global COVID-19 pandemic, which necessitated consideration that the patient might be at risk for infection with the SARS-CoV-2 virus that causes COVID-19. Institutional protocols and algorithms that pertain to the evaluation of patients at risk  for COVID-19 are in a state of rapid change based on information released by regulatory bodies including the CDC and federal  and state organizations. These policies and algorithms were followed during the patient's care in the ED.    Patient is a 59 year old who presents with severe upper abdominal pain.  Patient states it is related to her gallbladder.  Will get ultrasound of her gallbladder to further evaluate.  Will get labs to evaluate for electrolyte abnormalities, AKI.  Patient does have some T wave inversions I doubt it is ACS given she seems to be tender epigastrically but will get cardiac markers given no recent EKG to compare to.  Will give pain meds and continue to reevaluate patient.   On review of patient's prior records patient was admitted 1 year ago with a discharge summary as below "Intractable Nausea/Vomiting with inability to tolerate po and failure to thrive, severe malnutrition: This has been a long standing issue which has been evaluated with extensive workup several times in the past. CT Abdomen Pelvis, CTA Chest, CXR, and RUQ US performed over the last three months at [facility] have all been unremarkable. EGD on last admission at [facility] in July 2019 showed esophagitis and gastritis of superficial fundus and antrum. She was susequently discharged home on a PPI. Despite this, she continued to have severe symptoms with progressive weight loss (about 10 lbs since last admission) and inability to tolerate po."  Given this above information will add on a PPI.  CT imaging was reassuring.  There is some concern for collapsed appearance versus diffuse colitis however patient is having no diarrhea so I think is less likely colitis.  I did discuss with patient she states that this is chronic issue that she is had with pain and vomiting.  She states that she sometimes needs to be admitted for symptomatic management but they can never figure out why she has this.  Discussed with patient trialing some Haldol and GI cocktail.  Patient handed off to pending reevaluation and p.o. challenge       ____________________________________________   FINAL CLINICAL IMPRESSION(S) / ED DIAGNOSES   Final diagnoses:  Right upper quadrant pain  Nausea and vomiting, intractability of vomiting not specified, unspecified vomiting type      MEDICATIONS GIVEN DURING THIS VISIT:  Medications  haloperidol lactate (HALDOL) injection 2 mg (has no administration in time range)  alum & mag hydroxide-simeth (MAALOX/MYLANTA) 200-200-20 MG/5ML suspension 30 mL (has no administration in time range)    And  lidocaine (XYLOCAINE) 2 % viscous mouth solution 15 mL (has no administration in time range)  lactated ringers bolus 1,000 mL (1,000 mLs Intravenous New Bag/Given 01/08/20 2124)  ondansetron (ZOFRAN) injection 4 mg (4 mg Intravenous Given 01/08/20 2125)  pantoprazole (PROTONIX) injection 40 mg (40 mg Intravenous Given 01/08/20 2129)  iohexol (OMNIPAQUE) 300 MG/ML solution 75 mL (75 mLs Intravenous Contrast Given 01/08/20 2228)     ED Discharge Orders    None       Note:  This document was prepared using Dragon voice recognition software and may include unintentional dictation errors.   Vanessa Franklin, MD 01/08/20 [phone]

## 2020-01-08 NOTE — ED Triage Notes (Signed)
Pt arrived from home via [facility] with sudden sharp right upper quadrant abdominal pain that started around 1600, similar to a previous gallbladder attack. Upon arrival pt is pale and diaphoretic.

## 2020-01-08 NOTE — ED Notes (Signed)
Pt transported to CT ?

## 2020-01-08 NOTE — ED Notes (Signed)
Pt reporting pain and nausea has returned but she is willing to try PO medication. Call bell in reach and pt verbalized understanding to call RN if she begins vomiting or is unable to keep PO medication down.

## 2020-01-09 ENCOUNTER — Encounter: Payer: Self-pay | Admitting: Internal Medicine

## 2020-01-09 DIAGNOSIS — K529 Noninfective gastroenteritis and colitis, unspecified: Secondary | ICD-10-CM | POA: Diagnosis present

## 2020-01-09 DIAGNOSIS — I10 Essential (primary) hypertension: Secondary | ICD-10-CM | POA: Diagnosis present

## 2020-01-09 DIAGNOSIS — K219 Gastro-esophageal reflux disease without esophagitis: Secondary | ICD-10-CM | POA: Diagnosis present

## 2020-01-09 LAB — COMPREHENSIVE METABOLIC PANEL
ALT: 9 U/L (ref 0–44)
AST: 17 U/L (ref 15–41)
Albumin: 3.6 g/dL (ref 3.5–5.0)
Alkaline Phosphatase: 109 U/L (ref 38–126)
Anion gap: 11 (ref 5–15)
BUN: 5 mg/dL — ABNORMAL LOW (ref 6–20)
CO2: 23 mmol/L (ref 22–32)
Calcium: 9.1 mg/dL (ref 8.9–10.3)
Chloride: 102 mmol/L (ref 98–111)
Creatinine, Ser: 0.51 mg/dL (ref 0.44–1.00)
GFR calc Af Amer: >60 mL/min (ref >60)
GFR calc non Af Amer: >60 mL/min (ref >60)
Glucose, Bld: 141 mg/dL — ABNORMAL HIGH (ref 70–99)
Potassium: 4 mmol/L (ref 3.5–5.1)
Sodium: 136 mmol/L (ref 135–145)
Total Bilirubin: 0.6 mg/dL (ref 0.3–1.2)
Total Protein: 6.7 g/dL (ref 6.5–8.1)

## 2020-01-09 LAB — CBC
HCT: 34.6 % — ABNORMAL LOW (ref 36.0–46.0)
Hemoglobin: 11.6 g/dL — ABNORMAL LOW (ref 12.0–15.0)
MCH: 28.2 pg (ref 26.0–34.0)
MCHC: 33.5 g/dL (ref 30.0–36.0)
MCV: 84 fL (ref 80.0–100.0)
Platelets: 680 10*3/uL — ABNORMAL HIGH (ref 150–400)
RBC: 4.12 MIL/uL (ref 3.87–5.11)
RDW: 17.6 % — ABNORMAL HIGH (ref 11.5–15.5)
WBC: 15 10*3/uL — ABNORMAL HIGH (ref 4.0–10.5)
nRBC: 0 % (ref 0.0–0.2)

## 2020-01-09 LAB — HEMOGLOBIN A1C
Hgb A1c MFr Bld: 5.3 % (ref 4.8–5.6)
Mean Plasma Glucose: 105.41 mg/dL

## 2020-01-09 LAB — SARS CORONAVIRUS 2 (TAT 6-24 HRS): SARS Coronavirus 2: NEGATIVE

## 2020-01-09 LAB — TROPONIN I (HIGH SENSITIVITY): Troponin I (High Sensitivity): 4 ng/L (ref <18)

## 2020-01-09 LAB — HIV ANTIBODY (ROUTINE TESTING W REFLEX): HIV Screen 4th Generation wRfx: NONREACTIVE

## 2020-01-09 LAB — LACTIC ACID, PLASMA: Lactic Acid, Venous: 1.1 mmol/L (ref 0.5–1.9)

## 2020-01-09 MED ORDER — CIPROFLOXACIN IN D5W 400 MG/200ML IV SOLN
400.0000 mg | Freq: Once | INTRAVENOUS | Status: AC
  Administered 2020-01-09: 400 mg via INTRAVENOUS
  Filled 2020-01-09: qty 200

## 2020-01-09 MED ORDER — ONDANSETRON HCL 4 MG PO TABS: 4.0000 mg | ORAL_TABLET | Freq: Four times a day (QID) | ORAL | Status: DC | PRN

## 2020-01-09 MED ORDER — MORPHINE SULFATE (PF) 4 MG/ML IV SOLN
INTRAVENOUS | Status: AC
  Filled 2020-01-09: qty 1

## 2020-01-09 MED ORDER — CIPROFLOXACIN IN D5W 400 MG/200ML IV SOLN
400.0000 mg | Freq: Two times a day (BID) | INTRAVENOUS | Status: DC
  Administered 2020-01-09 – 2020-01-13 (×8): 400 mg via INTRAVENOUS
  Filled 2020-01-09 (×11): qty 200

## 2020-01-09 MED ORDER — ONDANSETRON HCL 4 MG/2ML IJ SOLN
4.0000 mg | Freq: Four times a day (QID) | INTRAMUSCULAR | Status: DC | PRN
  Administered 2020-01-09: 4 mg via INTRAVENOUS

## 2020-01-09 MED ORDER — MORPHINE SULFATE (PF) 2 MG/ML IV SOLN
2.0000 mg | INTRAVENOUS | Status: DC | PRN
  Administered 2020-01-09 – 2020-01-12 (×10): 2 mg via INTRAVENOUS
  Filled 2020-01-09 (×11): qty 1

## 2020-01-09 MED ORDER — MORPHINE SULFATE (PF) 4 MG/ML IV SOLN: 4.0000 mg | INTRAVENOUS | Status: DC | PRN

## 2020-01-09 MED ORDER — ENOXAPARIN SODIUM 40 MG/0.4ML ~~LOC~~ SOLN
40.0000 mg | SUBCUTANEOUS | Status: DC
  Administered 2020-01-09 – 2020-01-13 (×5): 40 mg via SUBCUTANEOUS
  Filled 2020-01-09 (×5): qty 0.4

## 2020-01-09 MED ORDER — LACTATED RINGERS IV SOLN: INTRAVENOUS | Status: DC

## 2020-01-09 MED ORDER — ONDANSETRON HCL 4 MG/2ML IJ SOLN
INTRAMUSCULAR | Status: AC
  Filled 2020-01-09: qty 2

## 2020-01-09 MED ORDER — PROMETHAZINE HCL 25 MG/ML IJ SOLN
25.0000 mg | Freq: Four times a day (QID) | INTRAMUSCULAR | Status: DC | PRN
  Administered 2020-01-09 – 2020-01-12 (×9): 25 mg via INTRAVENOUS
  Filled 2020-01-09 (×9): qty 1

## 2020-01-09 MED ORDER — ONDANSETRON HCL 4 MG/2ML IJ SOLN
4.0000 mg | Freq: Four times a day (QID) | INTRAMUSCULAR | Status: DC | PRN
  Administered 2020-01-09 – 2020-01-13 (×11): 4 mg via INTRAVENOUS
  Filled 2020-01-09 (×11): qty 2

## 2020-01-09 MED ORDER — KETOROLAC TROMETHAMINE 30 MG/ML IJ SOLN
INTRAMUSCULAR | Status: AC
  Filled 2020-01-09: qty 1

## 2020-01-09 MED ORDER — METRONIDAZOLE IN NACL 5-0.79 MG/ML-% IV SOLN
500.0000 mg | Freq: Three times a day (TID) | INTRAVENOUS | Status: DC
  Administered 2020-01-09 – 2020-01-13 (×14): 500 mg via INTRAVENOUS
  Filled 2020-01-09 (×17): qty 100

## 2020-01-09 MED ORDER — OXYCODONE HCL 5 MG PO TABS
10.0000 mg | ORAL_TABLET | Freq: Four times a day (QID) | ORAL | Status: DC | PRN
  Administered 2020-01-09 – 2020-01-13 (×7): 10 mg via ORAL
  Filled 2020-01-09 (×7): qty 2

## 2020-01-09 MED ORDER — ALPRAZOLAM 0.5 MG PO TABS
0.5000 mg | ORAL_TABLET | Freq: Two times a day (BID) | ORAL | Status: DC
  Administered 2020-01-09 – 2020-01-13 (×8): 0.5 mg via ORAL
  Filled 2020-01-09 (×8): qty 1

## 2020-01-09 MED ORDER — KETOROLAC TROMETHAMINE 30 MG/ML IJ SOLN
30.0000 mg | Freq: Once | INTRAMUSCULAR | Status: AC
  Administered 2020-01-09: 30 mg via INTRAVENOUS

## 2020-01-09 NOTE — Progress Notes (Signed)
Patient dry heaving. Phenergan administered Iv and cold washcloth placed on back of neck. No vomit observed. Patient has emesis bag in bed with her.

## 2020-01-09 NOTE — ED Notes (Signed)
Pt unable to finish all of PO medication and reports nausea and pain have not improved any further. MD made aware.

## 2020-01-09 NOTE — Progress Notes (Signed)
Patient requesting home dose of xanax be ordered. MD notified. Report given to [facility] RN. Patient to be transferred to floor.

## 2020-01-09 NOTE — ED Notes (Signed)
Pt reporting continued N/V. Requesting more nausea medications. Prn Zofran given

## 2020-01-09 NOTE — Progress Notes (Signed)
PHARMACY -  BRIEF ANTIBIOTIC NOTE   Pharmacy has received consult(s) for Cipro from an ED provider.  The patient's profile has been reviewed for ht/wt/allergies/indication/available labs.    One time order(s) placed for Cipro 400mg    Further antibiotics/pharmacy consults should be ordered by admitting physician if indicated.                       Thank you, A 01/09/2020  12:38 AM

## 2020-01-09 NOTE — Progress Notes (Addendum)
She feels a little better today.  She said her abdominal pain is down to 5/10.  She has some nausea this morning but no vomiting.  She said she had vomited previously.  Vital signs are stable.  Physical exam is significant for epigastric tenderness but no rebound tenderness or guarding.  IV Zofran was added to IV Phenergan because she felt her nausea was uncontrolled..  Continue empiric IV antibiotics and analgesics.

## 2020-01-09 NOTE — Progress Notes (Signed)
Patient received from main ED

## 2020-01-09 NOTE — H&P (Signed)
History and Physical   Deborah Griffin [name] DOB: 09/30/1961 DOA: 01/08/2020  Referring MD/NP/PA: Dr. Fuller Plan  PCP: Armando Gang, FNP   Outpatient Specialists: None  Patient coming from: Home  Chief Complaint: Abdominal pain  HPI: Deborah Griffin is a 59 y.o. female with medical history significant of sciatica, GERD, hypertension, anxiety disorder, recurrent abdominal pain with chronic pain syndrome who presented to the ER with 9 out of 10 central abdominal pain.  Pain was sharp and occasionally colicky.  In the central abdomen.  Associated with nausea and vomiting.  Has had no diarrhea.  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  She has had previous episodes of abdominal pain associated with that.  This is slightly different however.  She was evaluated with a CT abdomen and pelvis showing acute colitis.  Patient also still complain of pain despite treatment in the ER so she is being admitted to the hospital for treatment.  ED Course: Temperature is 100 blood pressure 174/97 pulse 100 respiratory 24 oxygen sat 97% on room air.  See entirely within normal.  Chemistry showed glucose 178 and CO2 of 20 otherwise all within normal.  Troponin and lactic acid all within normal.  CT abdomen pelvis shows collapse appearance versus diffuse colitis changes of the colon.  Possible small stone at the gallbladder fundus and some emphysema.  Patient is therefore being admitted to the hospital for further evaluation  Review of Systems: As per HPI otherwise 10 point review of systems negative.    Past Medical History:  Diagnosis Date   Anxiety    Lung collapse    Sciatica     History reviewed. No pertinent surgical history.   reports that she has been smoking. She has been smoking about 0.50 packs per day. She does not have any smokeless tobacco history on file. She reports that she does not drink alcohol or use drugs.  Allergies  Allergen  Reactions   Penicillins     Family History  Problem Relation Age of Onset   Cancer Mother      Prior to Admission medications   Medication Sig Start Date End Date Taking? Authorizing Provider  ALPRAZolam Prudy Feeler) 0.5 MG tablet Take 1 tablet (0.5 mg total) by mouth 2 (two) times daily. 01/15/16   Krebs, Laurel Dimmer, NP  escitalopram (LEXAPRO) 5 MG tablet Take 5 mg by mouth daily. 05/15/18   [provider]  lisinopril (PRINIVIL,ZESTRIL) 20 MG tablet Take 20 mg by mouth daily. 06/28/18   [provider]  naproxen (NAPROSYN) 500 MG tablet Take 1 tablet (500 mg total) by mouth 2 (two) times daily with a meal. Patient not taking: Reported on 07/11/2018 01/15/16   Loura Pardon, NP  pantoprazole (PROTONIX) 40 MG tablet Take 40 mg by mouth 2 (two) times daily. 07/02/18   [provider]  traMADol (ULTRAM) 50 MG tablet Take 50 mg by mouth 2 (two) times daily. 07/03/18   [provider]    Physical Exam: Vitals:   01/08/20 2110 01/08/20 2300 01/08/20 2330 01/09/20 0000  BP: (!) 164/92 (!) 171/96 (!) 174/97 (!) 165/96  Pulse: 99 100 98 99  Resp: 16 (!) 24 10 18   SpO2: 100% 97% 98% 100%  Weight: 45.4 kg     Height: 5\' 2"  (1.575 m)         Constitutional: Very anxious, in obvious distress due to pain Vitals:   01/08/20 2110 01/08/20 2300 01/08/20  2330 01/09/20 0000  BP: (!) 164/92 (!) 171/96 (!) 174/97 (!) 165/96  Pulse: 99 100 98 99  Resp: 16 (!) 24 10 18   SpO2: 100% 97% 98% 100%  Weight: 45.4 kg     Height: 5\' 2"  (1.575 m)      Eyes: PERRL, lids and conjunctivae normal ENMT: Mucous membranes are moist. Posterior pharynx clear of any exudate or lesions.Normal dentition.  Neck: normal, supple, no masses, no thyromegaly Respiratory: clear to auscultation bilaterally, no wheezing, no crackles. Normal respiratory effort. No accessory muscle use.  Cardiovascular: Sinus tachycardia, no murmurs / rubs / gallops. No extremity edema. 2+ pedal pulses. No  carotid bruits.  Abdomen: Mildly distended abdomen, diffuse tenderness with no guarding more in the right upper quadrant and epigastrium no masses palpated. No hepatosplenomegaly. Bowel sounds positive.  Musculoskeletal: no clubbing / cyanosis. No joint deformity upper and lower extremities. Good ROM, no contractures. Normal muscle tone.  Skin: no rashes, lesions, ulcers. No induration Neurologic: CN 2-12 grossly intact. Sensation intact, DTR normal. Strength 5/5 in all 4.  Psychiatric: Normal judgment and insight. Alert and oriented x 3. Normal mood.     Labs on Admission: I have personally reviewed following labs and imaging studies  CBC: Recent Labs  Lab 01/08/20 2115  WBC 9.2  NEUTROABS 7.1  HGB 12.4  HCT 36.6  MCV 82.2  PLT 719*   Basic Metabolic Panel: Recent Labs  Lab 01/08/20 2115  NA 136  K 3.5  CL 103  CO2 20*  GLUCOSE 178*  BUN 6  CREATININE 0.57  CALCIUM 9.4   GFR: Estimated Creatinine Clearance: 55.6 mL/min (by C-G formula based on SCr of 0.57 mg/dL). Liver Function Tests: Recent Labs  Lab 01/08/20 2115  AST 18  ALT 9  ALKPHOS 118  BILITOT 0.7  PROT 7.1  ALBUMIN 3.9   Recent Labs  Lab 01/08/20 2115  LIPASE 33   No results for input(s): AMMONIA in the last 168 hours. Coagulation Profile: No results for input(s): INR, PROTIME in the last 168 hours. Cardiac Enzymes: No results for input(s): CKTOTAL, CKMB, CKMBINDEX, TROPONINI in the last 168 hours. BNP (last 3 results) No results for input(s): PROBNP in the last 8760 hours. HbA1C: No results for input(s): HGBA1C in the last 72 hours. CBG: No results for input(s): GLUCAP in the last 168 hours. Lipid Profile: No results for input(s): CHOL, HDL, LDLCALC, TRIG, CHOLHDL, LDLDIRECT in the last 72 hours. Thyroid Function Tests: No results for input(s): TSH, T4TOTAL, FREET4, T3FREE, THYROIDAB in the last 72 hours. Anemia Panel: No results for input(s): VITAMINB12, FOLATE, FERRITIN, TIBC, IRON,  RETICCTPCT in the last 72 hours. Urine analysis: No results found for: COLORURINE, APPEARANCEUR, LABSPEC, PHURINE, GLUCOSEU, HGBUR, BILIRUBINUR, KETONESUR, PROTEINUR, UROBILINOGEN, NITRITE, LEUKOCYTESUR Sepsis Labs: @LABRCNTIP (procalcitonin:4,lacticidven:4) )No results found for this or any previous visit (from the past 240 hour(s)).   Radiological Exams on Admission: CT ABDOMEN PELVIS W CONTRAST  Addendum Date: 01/08/2020   ADDENDUM REPORT: 01/08/2020 23:13 ADDENDUM: Mild emphysema at the lung bases. Electronically Signed   By: M.D.   On: 01/08/2020 23:13   Result Date: 01/08/2020 CLINICAL DATA:  Right upper quadrant pain EXAM: CT ABDOMEN AND PELVIS WITH CONTRAST TECHNIQUE: Multidetector CT imaging of the abdomen and pelvis was performed using the standard protocol following bolus administration of intravenous contrast. CONTRAST:  30mL OMNIPAQUE IOHEXOL 300 MG/ML  SOLN COMPARISON:  CT 01/29/2006 FINDINGS: Lower chest: No acute abnormality. Hepatobiliary: No focal liver abnormality is seen. No gallbladder  wall thickening or biliary dilatation. Possible small stones at the gallbladder fundus. Pancreas: Unremarkable. No pancreatic ductal dilatation or surrounding inflammatory changes. Spleen: Normal in size without focal abnormality. Adrenals/Urinary Tract: Adrenal glands are unremarkable. Kidneys are normal, without renal calculi, focal lesion, or hydronephrosis. Bladder is unremarkable. Stomach/Bowel: The stomach is nonenlarged. No dilated small bowel. Possible diffuse colon wall thickening. Negative appendix. Vascular/Lymphatic: Moderate aortic atherosclerosis without aneurysm. No significantly enlarged lymph nodes Reproductive: Uterus and bilateral adnexa are unremarkable. Other: Negative for free air or free fluid Musculoskeletal: No acute or significant osseous findings. IMPRESSION: 1. Collapsed appearance versus diffuse colitis type changes of the colon. 2. Possible small stones at the  gallbladder fundus. 3. Otherwise no CT evidence for acute intra-abdominal or pelvic abnormality. Electronically Signed: By: Jasmine Pang M.D. On: 01/08/2020 22:40   US ABDOMEN LIMITED RUQ  Result Date: 01/08/2020 CLINICAL DATA:  Right upper quadrant abdominal pain. EXAM: ULTRASOUND ABDOMEN LIMITED RIGHT UPPER QUADRANT COMPARISON:  CT from same day. FINDINGS: Gallbladder: No gallstones or wall thickening visualized. No sonographic Murphy sign noted by sonographer. Common bile duct: Diameter: 2 mm Liver: No focal lesion identified. Within normal limits in parenchymal echogenicity. Portal vein is patent on color Doppler imaging with normal direction of blood flow towards the liver. Other: None. IMPRESSION: No abnormality detected. No explanation for the patient's right upper quadrant abdominal pain. Electronically Signed   By: Katherine Mantle M.D.   On: 01/08/2020 22:40      Assessment/Plan Principal Problem:   Acute colitis Active Problems:   Anxiety   Low back pain with sciatica   GERD (gastroesophageal reflux disease)   Essential hypertension     #1 acute colitis: Patient will be admitted to the hospital.  Initiate IV Cipro and Flagyl.  Pain control.  Nausea vomiting control.  IV fluids and supportive care.  #2 GERD: Continue with PPIs.  #3 anxiety disorder: Confirm and resume home regimen  #4 essential hypertension: On lisinopril at home.  Resume when able to keep food down  #5 chronic pain syndrome: Will be careful with narcotics.   DVT prophylaxis: Lovenox Code Status: Full code Family Communication: No family at bedside Disposition Plan: Home Consults called: None Admission status: Inpatient  Severity of Illness: The appropriate patient status for this patient is INPATIENT. Inpatient status is judged to be reasonable and necessary in order to provide the required intensity of service to ensure the patient's safety. The patient's presenting symptoms, physical exam  findings, and initial radiographic and laboratory data in the context of their chronic comorbidities is felt to place them at high risk for further clinical deterioration. Furthermore, it is not anticipated that the patient will be medically stable for discharge from the hospital within 2 midnights of admission. The following factors support the patient status of inpatient.   " The patient's presenting symptoms include abdominal pain nausea vomiting. " The worrisome physical exam findings include diffuse abdominal tenderness. " The initial radiographic and laboratory data are worrisome because of CT showing colitis. " The chronic co-morbidities include hypertension and chronic pain syndrome.   * I certify that at the point of admission it is my clinical judgment that the patient will require inpatient hospital care spanning beyond 2 midnights from the point of admission due to high intensity of service, high risk for further deterioration and high frequency of surveillance required.Lonia Blood MD [facility] Pager [phone]  If 7PM-7AM, please contact night-coverage [email or web address] Password [facility]  01/09/2020, 12:40  AM

## 2020-01-10 ENCOUNTER — Encounter: Payer: Self-pay | Admitting: Internal Medicine

## 2020-01-10 DIAGNOSIS — I10 Essential (primary) hypertension: Secondary | ICD-10-CM

## 2020-01-10 DIAGNOSIS — E876 Hypokalemia: Secondary | ICD-10-CM

## 2020-01-10 DIAGNOSIS — F419 Anxiety disorder, unspecified: Secondary | ICD-10-CM

## 2020-01-10 DIAGNOSIS — E871 Hypo-osmolality and hyponatremia: Secondary | ICD-10-CM | POA: Diagnosis not present

## 2020-01-10 LAB — CBC WITH DIFFERENTIAL/PLATELET
Abs Immature Granulocytes: 0.07 10*3/uL (ref 0.00–0.07)
Basophils Absolute: 0 10*3/uL (ref 0.0–0.1)
Basophils Relative: 0 %
Eosinophils Absolute: 0 10*3/uL (ref 0.0–0.5)
Eosinophils Relative: 0 %
HCT: 37 % (ref 36.0–46.0)
Hemoglobin: 12.5 g/dL (ref 12.0–15.0)
Immature Granulocytes: 1 %
Lymphocytes Relative: 12 %
Lymphs Abs: 1.7 10*3/uL (ref 0.7–4.0)
MCH: 27.8 pg (ref 26.0–34.0)
MCHC: 33.8 g/dL (ref 30.0–36.0)
MCV: 82.2 fL (ref 80.0–100.0)
Monocytes Absolute: 1.1 10*3/uL — ABNORMAL HIGH (ref 0.1–1.0)
Monocytes Relative: 8 %
Neutro Abs: 11.2 10*3/uL — ABNORMAL HIGH (ref 1.7–7.7)
Neutrophils Relative %: 79 %
Platelets: 600 10*3/uL — ABNORMAL HIGH (ref 150–400)
RBC: 4.5 MIL/uL (ref 3.87–5.11)
RDW: 17.5 % — ABNORMAL HIGH (ref 11.5–15.5)
WBC: 14.1 10*3/uL — ABNORMAL HIGH (ref 4.0–10.5)
nRBC: 0 % (ref 0.0–0.2)

## 2020-01-10 LAB — BASIC METABOLIC PANEL
Anion gap: 10 (ref 5–15)
BUN: 5 mg/dL — ABNORMAL LOW (ref 6–20)
CO2: 25 mmol/L (ref 22–32)
Calcium: 8.7 mg/dL — ABNORMAL LOW (ref 8.9–10.3)
Chloride: 96 mmol/L — ABNORMAL LOW (ref 98–111)
Creatinine, Ser: 0.55 mg/dL (ref 0.44–1.00)
GFR calc Af Amer: >60 mL/min (ref >60)
GFR calc non Af Amer: >60 mL/min (ref >60)
Glucose, Bld: 97 mg/dL (ref 70–99)
Potassium: 3.2 mmol/L — ABNORMAL LOW (ref 3.5–5.1)
Sodium: 131 mmol/L — ABNORMAL LOW (ref 135–145)

## 2020-01-10 LAB — MAGNESIUM: Magnesium: 1.6 mg/dL — ABNORMAL LOW (ref 1.7–2.4)

## 2020-01-10 LAB — PHOSPHORUS: Phosphorus: 3.6 mg/dL (ref 2.5–4.6)

## 2020-01-10 MED ORDER — LISINOPRIL 20 MG PO TABS
20.0000 mg | ORAL_TABLET | Freq: Every day | ORAL | Status: DC
  Administered 2020-01-10 – 2020-01-13 (×4): 20 mg via ORAL
  Filled 2020-01-10 (×4): qty 1

## 2020-01-10 MED ORDER — ADULT MULTIVITAMIN W/MINERALS CH
1.0000 | ORAL_TABLET | Freq: Every day | ORAL | Status: DC
  Administered 2020-01-11 – 2020-01-13 (×3): 1 via ORAL
  Filled 2020-01-10 (×3): qty 1

## 2020-01-10 MED ORDER — PANTOPRAZOLE SODIUM 40 MG IV SOLR
40.0000 mg | INTRAVENOUS | Status: DC
  Administered 2020-01-10 – 2020-01-12 (×3): 40 mg via INTRAVENOUS
  Filled 2020-01-10 (×3): qty 40

## 2020-01-10 MED ORDER — MAGNESIUM SULFATE 2 GM/50ML IV SOLN
2.0000 g | Freq: Once | INTRAVENOUS | Status: AC
  Administered 2020-01-10: 2 g via INTRAVENOUS
  Filled 2020-01-10: qty 50

## 2020-01-10 MED ORDER — BOOST / RESOURCE BREEZE PO LIQD CUSTOM
1.0000 | Freq: Three times a day (TID) | ORAL | Status: DC
  Administered 2020-01-10 – 2020-01-13 (×6): 1 via ORAL

## 2020-01-10 MED ORDER — POTASSIUM CHLORIDE IN NACL 40-0.9 MEQ/L-% IV SOLN
INTRAVENOUS | Status: DC
  Administered 2020-01-10 – 2020-01-11 (×2): 75 mL/h via INTRAVENOUS
  Filled 2020-01-10 (×4): qty 1000

## 2020-01-10 MED ORDER — KETOROLAC TROMETHAMINE 30 MG/ML IJ SOLN
30.0000 mg | Freq: Four times a day (QID) | INTRAMUSCULAR | Status: DC | PRN
  Filled 2020-01-10: qty 1

## 2020-01-10 NOTE — Progress Notes (Addendum)
Progress Note    Deborah Griffin  IHK:[id number] DOB: [birth year]  DOA: 01/08/2020 PCP: Armando Gang, FNP      Brief Narrative:    Medical records reviewed and are as summarized below:  Deborah Griffin is an 59 y.o. female with medical history significant of sciatica, GERD, hypertension, anxiety disorder, recurrent abdominal pain with chronic pain syndrome who presented to the ER with 9 out of 10 central abdominal pain.  Pain was sharp and occasionally colicky.  He was hesitant with nausea and vomiting.  CT abdomen pelvis showed acute colitis.      Assessment/Plan:   Principal Problem:   Acute colitis Active Problems:   Anxiety   Low back pain with sciatica   GERD (gastroesophageal reflux disease)   Essential hypertension   Hyponatremia   Hypomagnesemia   Hypokalemia   Acute colitis/abdominal pain/vomiting/leukocytosis: Clear liquid diet as tolerated.  Continue empiric IV antibiotics (Cipro and Flagyl).  Her father was concerned about recurrent abdominal pain and vomiting and suggested that the problem may be due to her gallbladder and therefore requested gallbladder be removed if possible.  He was informed that gallbladder had some gallstones on imaging but it did not look inflamed or infected.  Consulted general surgeon for further evaluation.  Hyponatremia: Continue IV fluids  Hypokalemia and hypomagnesemia: Replete potassium and magnesium  Thrombocytosis: Improving.  Likely reactive.  Hypertension: Patient said lisinopril was discontinued by her PCP because her blood pressure had been running low.  Blood pressure is uncontrolled in the hospital so lisinopril will be restarted.  Patient is agreeable with this plan.  GERD with history of esophagitis and gastritis noted on EGD in July 2019: Treat with IV Protonix  Anxiety: Continue Xanax as needed.  Of note, patient also has chronic/recurrent abdominal pain but this abdominal pain is acute and different.   Patient has had extensive work-up in the past for recurrent nausea, vomiting and abdominal pain.  She was seen by [facility] gastroenterology for this.  Differential diagnosis considered according to chart review included functional nausea/vomiting, gastroparesis, cyclic vomiting syndrome.    Body mass index is 17.34 kg/m.  (Underweight)   Family Communication/Anticipated D/C date and plan/Code Status   DVT prophylaxis: Lovenox Code Status: Full code Family Communication: Plan discussed with her father Disposition Plan: Patient is from home.  Possible discharge to home in 2 to 3 days if vomiting, abdominal pain and leukocytosis improved.      Subjective:   C/o epigastric pain, dry heaves and vomiting. She vomited twice this morning. No diarrhea. Her doctor took her off of Lisinopril because her BP went too low  Objective:    Vitals:   01/09/20 1400 01/09/20 1808 01/09/20 2253 01/10/20 0625  BP: (!) 153/78 (!) 171/89 (!) 163/87 (!) 164/85  Pulse:  100 (!) 102 (!) 104  Resp:   16 16  Temp:  99.1 F (37.3 C) 98.3 F (36.8 C) 98.4 F (36.9 C)  TempSrc:  Oral Oral Oral  SpO2:  99% 98% 98%  Weight:  43 kg    Height:  5\' 2"  (1.575 m)      Intake/Output Summary (Last 24 hours) at 01/10/2020 1335 Last data filed at 01/10/2020 0911 Gross per 24 hour  Intake 3278.2 ml  Output --  Net 3278.2 ml   Filed Weights   01/08/20 2110 01/09/20 1808  Weight: 45.4 kg 43 kg    Exam:  GEN: NAD SKIN: No rash EYES: EOMI ENT: MMM CV:  RRR PULM: CTA B ABD: soft, ND, epigastric tenderness without rebound tenderness or guarding, +BS CNS: AAO x 3, non focal EXT: No edema or tenderness   Data Reviewed:   I have personally reviewed following labs and imaging studies:  Labs: Labs show the following:   Basic Metabolic Panel: Recent Labs  Lab 01/08/20 2115 01/08/20 2115 01/09/20 0619 01/10/20 0830  NA 136  --  136 131*  K 3.5   < > 4.0 3.2*  CL 103  --  102 96*  CO2 20*  --  23 25   GLUCOSE 178*  --  141* 97  BUN 6  --  5* 5*  CREATININE 0.57  --  0.51 0.55  CALCIUM 9.4  --  9.1 8.7*  MG  --   --   --  1.6*  PHOS  --   --   --  3.6   < > = values in this interval not displayed.   GFR Estimated Creatinine Clearance: 52.7 mL/min (by C-G formula based on SCr of 0.55 mg/dL). Liver Function Tests: Recent Labs  Lab 01/08/20 2115 01/09/20 0619  AST 18 17  ALT 9 9  ALKPHOS 118 109  BILITOT 0.7 0.6  PROT 7.1 6.7  ALBUMIN 3.9 3.6   Recent Labs  Lab 01/08/20 2115  LIPASE 33   No results for input(s): AMMONIA in the last 168 hours. Coagulation profile No results for input(s): INR, PROTIME in the last 168 hours.  CBC: Recent Labs  Lab 01/08/20 2115 01/09/20 0619 01/10/20 0830  WBC 9.2 15.0* 14.1*  NEUTROABS 7.1  --  11.2*  HGB 12.4 11.6* 12.5  HCT 36.6 34.6* 37.0  MCV 82.2 84.0 82.2  PLT 719* 680* 600*   Cardiac Enzymes: No results for input(s): CKTOTAL, CKMB, CKMBINDEX, TROPONINI in the last 168 hours. BNP (last 3 results) No results for input(s): PROBNP in the last 8760 hours. CBG: No results for input(s): GLUCAP in the last 168 hours. D-Dimer: No results for input(s): DDIMER in the last 72 hours. Hgb A1c: Recent Labs    01/09/20 0103  HGBA1C 5.3   Lipid Profile: No results for input(s): CHOL, HDL, LDLCALC, TRIG, CHOLHDL, LDLDIRECT in the last 72 hours. Thyroid function studies: No results for input(s): TSH, T4TOTAL, T3FREE, THYROIDAB in the last 72 hours.  Invalid input(s): FREET3 Anemia work up: No results for input(s): VITAMINB12, FOLATE, FERRITIN, TIBC, IRON, RETICCTPCT in the last 72 hours. Sepsis Labs: Recent Labs  Lab 01/08/20 2115 01/08/20 2334 01/09/20 0619 01/10/20 0830  WBC 9.2  --  15.0* 14.1*  LATICACIDVEN 1.7 1.1  --   --     Microbiology Recent Results (from the past 240 hour(s))  SARS CORONAVIRUS 2 (TAT 6-24 HRS) Nasopharyngeal Nasopharyngeal Swab     Status: None   Collection Time: 01/09/20  1:03 AM    Specimen: Nasopharyngeal Swab  Result Value Ref Range Status   SARS Coronavirus 2 NEGATIVE NEGATIVE Final    Comment: (NOTE) SARS-CoV-2 target nucleic acids are NOT DETECTED. The SARS-CoV-2 RNA is generally detectable in upper and lower respiratory specimens during the acute phase of infection. Negative results do not preclude SARS-CoV-2 infection, do not rule out co-infections with other pathogens, and should not be used as the sole basis for treatment or other patient management decisions. Negative results must be combined with clinical observations, patient history, and epidemiological information. The expected result is Negative. Fact Sheet for Patients: SugarRoll.be Fact Sheet for Healthcare Providers: https://www.woods-mathews.com/ This test is not  yet approved or cleared by the [place] and  has been authorized for detection and/or diagnosis of SARS-CoV-2 by FDA under an Emergency Use Authorization (EUA). This EUA will remain  in effect (meaning this test can be used) for the duration of the COVID-19 declaration under Section 56 4(b)(1) of the Act, 21 U.S.C. section 360bbb-3(b)(1), unless the authorization is terminated or revoked sooner. Performed at [facility], [address], [place], [place] [address]     Procedures and diagnostic studies:  CT ABDOMEN PELVIS W CONTRAST  Addendum Date: 01/08/2020   ADDENDUM REPORT: 01/08/2020 23:13 ADDENDUM: Mild emphysema at the lung bases. Electronically Signed   By: Jasmine Pang M.D.   On: 01/08/2020 23:13   Result Date: 01/08/2020 CLINICAL DATA:  Right upper quadrant pain EXAM: CT ABDOMEN AND PELVIS WITH CONTRAST TECHNIQUE: Multidetector CT imaging of the abdomen and pelvis was performed using the standard protocol following bolus administration of intravenous contrast. CONTRAST:  2mL OMNIPAQUE IOHEXOL 300 MG/ML  SOLN COMPARISON:  CT 01/29/2006 FINDINGS: Lower chest: No acute  abnormality. Hepatobiliary: No focal liver abnormality is seen. No gallbladder wall thickening or biliary dilatation. Possible small stones at the gallbladder fundus. Pancreas: Unremarkable. No pancreatic ductal dilatation or surrounding inflammatory changes. Spleen: Normal in size without focal abnormality. Adrenals/Urinary Tract: Adrenal glands are unremarkable. Kidneys are normal, without renal calculi, focal lesion, or hydronephrosis. Bladder is unremarkable. Stomach/Bowel: The stomach is nonenlarged. No dilated small bowel. Possible diffuse colon wall thickening. Negative appendix. Vascular/Lymphatic: Moderate aortic atherosclerosis without aneurysm. No significantly enlarged lymph nodes Reproductive: Uterus and bilateral adnexa are unremarkable. Other: Negative for free air or free fluid Musculoskeletal: No acute or significant osseous findings. IMPRESSION: 1. Collapsed appearance versus diffuse colitis type changes of the colon. 2. Possible small stones at the gallbladder fundus. 3. Otherwise no CT evidence for acute intra-abdominal or pelvic abnormality. Electronically Signed: By: Jasmine Pang M.D. On: 01/08/2020 22:40   US ABDOMEN LIMITED RUQ  Result Date: 01/08/2020 CLINICAL DATA:  Right upper quadrant abdominal pain. EXAM: ULTRASOUND ABDOMEN LIMITED RIGHT UPPER QUADRANT COMPARISON:  CT from same day. FINDINGS: Gallbladder: No gallstones or wall thickening visualized. No sonographic Murphy sign noted by sonographer. Common bile duct: Diameter: 2 mm Liver: No focal lesion identified. Within normal limits in parenchymal echogenicity. Portal vein is patent on color Doppler imaging with normal direction of blood flow towards the liver. Other: None. IMPRESSION: No abnormality detected. No explanation for the patient's right upper quadrant abdominal pain. Electronically Signed   By: Katherine Mantle M.D.   On: 01/08/2020 22:40    Medications:   . ALPRAZolam  0.5 mg Oral BID  . enoxaparin (LOVENOX)  injection  40 mg Subcutaneous Q24H  . feeding supplement  1 Container Oral TID BM  . lisinopril  20 mg Oral Daily  . [START ON 01/11/2020] multivitamin with minerals  1 tablet Oral Daily  . pantoprazole (PROTONIX) IV  40 mg Intravenous Q24H   Continuous Infusions: . 0.9 % NaCl with KCl 40 mEq / L 75 mL/hr (01/10/20 1145)  . ciprofloxacin 400 mg (01/10/20 0858)  . magnesium sulfate bolus IVPB    . metronidazole 500 mg (01/10/20 0857)     LOS: 1 day   Daune Colgate  [facility]     01/10/2020, 1:35 PM

## 2020-01-10 NOTE — Consult Note (Signed)
Date of Consultation:  01/10/2020  Requesting Physician:  Jennye Boroughs, MD  Reason for Consultation:  Abdominal pain  History of Present Illness: Deborah Griffin is a 59 y.o. female admitted on 2/16 with epigastric abdominal pain, nausea, and vomiting.  She has been at [facility] for episodes of this.  Patient reports that without a particular trigger, she will start having pain, nausea, and vomiting.  She would get fluids, nausea medications, and eventually discharged, but the cause of her symptoms has not been found.  She has had different workup including ultrasounds, CT scan, and upper endoscopy, as well as stool studies, and other laboratory workup.  There is mention of cyclical vomiting, related to marijuana, but the patient reports using CBD oil for her nausea and not marijuana.  After discharge from her admissions, she will be fine for some time, and then a few weeks later, will have the episode again.  Some episodes are more severe than others, and sometimes she is able to stay at home until it improves.  She recently lost her job and insurance and has not been able to follow up with GI as outpatient due to costs.  On this admission, she had laboratory workup showing a WBC that peaked at 15, with normal creatinine and normal LFTs.  She had an U/S which did not reveal any gallstones and no evidence of cholecystitis.  She has a CT scan which showed possible diffuse colonic wall thickening and colitis, vs underdistention of the colon.    Currently, she still has nausea and is unable to tolerated any po intake.  She had ginger ale earlier which she threw up after, as well as water which she also threw up.  The patient and her father are concerned that this could be related to her gallbladder, and they mention that other doctors have told her that.  Past Medical History: Past Medical History:  Diagnosis Date  . Anxiety   . Lung collapse   . Sciatica      Past Surgical History: --Tubal  ligation  Home Medications: Prior to Admission medications   Medication Sig Start Date End Date Taking? Authorizing Provider  ALPRAZolam Duanne Moron) 0.5 MG tablet Take 1 tablet (0.5 mg total) by mouth 2 (two) times daily. 01/15/16  Yes Krebs, Amy Lauren, NP  traMADol (ULTRAM) 50 MG tablet Take 50 mg by mouth 2 (two) times daily. 07/03/18  Yes [provider]  escitalopram (LEXAPRO) 5 MG tablet Take 5 mg by mouth daily. 05/15/18   [provider]  lisinopril (PRINIVIL,ZESTRIL) 20 MG tablet Take 20 mg by mouth daily. 06/28/18   [provider]  naproxen (NAPROSYN) 500 MG tablet Take 1 tablet (500 mg total) by mouth 2 (two) times daily with a meal. Patient not taking: Reported on 07/11/2018 01/15/16   Luciana Axe, NP  pantoprazole (PROTONIX) 40 MG tablet Take 40 mg by mouth 2 (two) times daily. 07/02/18   [provider]    Allergies: Allergies  Allergen Reactions  . Penicillins     Did it involve swelling of the face/tongue/throat, SOB, or low BP? Yes Did it involve sudden or severe rash/hives, skin peeling, or any reaction on the inside of your mouth or nose? Yes Did you need to seek medical attention at a hospital or doctor's office? Yes When did it last happen?Unknown If all above answers are "NO", may proceed with cephalosporin use.    Social History:  reports that she has been smoking. She  has been smoking about 0.50 packs per day. She has never used smokeless tobacco. She reports that she does not drink alcohol or use drugs.   Family History: Family History  Problem Relation Age of Onset  . Cancer Mother     Review of Systems: Review of Systems  Constitutional: Negative for chills and fever.  HENT: Negative for hearing loss.   Eyes: Negative for blurred vision.  Respiratory: Negative for shortness of breath.   Cardiovascular: Negative for chest pain.  Gastrointestinal: Positive for abdominal pain, nausea and vomiting. Negative for  constipation and diarrhea.  Genitourinary: Negative for dysuria.  Musculoskeletal: Positive for back pain.  Skin: Negative for rash.  Neurological: Negative for dizziness.  Psychiatric/Behavioral: Negative for depression.    Physical Exam BP (!) 164/85 (BP Location: Left Arm)   Pulse (!) 104   Temp 98.4 F (36.9 C) (Oral)   Resp 16   Ht 5\' 2"  (1.575 m)   Wt 43 kg   SpO2 98%   BMI 17.34 kg/m  CONSTITUTIONAL: No acute distress, very thin. HEENT:  Normocephalic, atraumatic, extraocular motion intact. NECK: Trachea is midline, and there is no jugular venous distension. RESPIRATORY:  Lungs are clear, and breath sounds are equal bilaterally. Normal respiratory effort without pathologic use of accessory muscles. CARDIOVASCULAR: Heart is regular without murmurs, gallops, or rubs. GI: The abdomen is soft, non-distended, with tenderness to palpation in the epigastric as well as right upper quadrant areas, with epigastric being worse.  No peritonitis. No pain anywhere else. MUSCULOSKELETAL:  Normal muscle strength and tone in all four extremities.  No peripheral edema or cyanosis. SKIN: Skin turgor is normal. There are no pathologic skin lesions.  NEUROLOGIC:  Motor and sensation is grossly normal.  Cranial nerves are grossly intact. PSYCH:  Alert and oriented to person, place and time. Affect is normal.  Laboratory Analysis: Results for orders placed or performed during the hospital encounter of 01/08/20 (from the past 24 hour(s))  CBC with Differential/Platelet     Status: Abnormal   Collection Time: 01/10/20  8:30 AM  Result Value Ref Range   WBC 14.1 (H) 4.0 - 10.5 K/uL   RBC 4.50 3.87 - 5.11 MIL/uL   Hemoglobin 12.5 12.0 - 15.0 g/dL   HCT 01/12/20 76.2 - 83.1 %   MCV 82.2 80.0 - 100.0 fL   MCH 27.8 26.0 - 34.0 pg   MCHC 33.8 30.0 - 36.0 g/dL   RDW 51.7 (H) 61.6 - 07.3 %   Platelets 600 (H) 150 - 400 K/uL   nRBC 0.0 0.0 - 0.2 %   Neutrophils Relative % 79 %   Neutro Abs 11.2 (H)  1.7 - 7.7 K/uL   Lymphocytes Relative 12 %   Lymphs Abs 1.7 0.7 - 4.0 K/uL   Monocytes Relative 8 %   Monocytes Absolute 1.1 (H) 0.1 - 1.0 K/uL   Eosinophils Relative 0 %   Eosinophils Absolute 0.0 0.0 - 0.5 K/uL   Basophils Relative 0 %   Basophils Absolute 0.0 0.0 - 0.1 K/uL   Immature Granulocytes 1 %   Abs Immature Granulocytes 0.07 0.00 - 0.07 K/uL  Basic metabolic panel     Status: Abnormal   Collection Time: 01/10/20  8:30 AM  Result Value Ref Range   Sodium 131 (L) 135 - 145 mmol/L   Potassium 3.2 (L) 3.5 - 5.1 mmol/L   Chloride 96 (L) 98 - 111 mmol/L   CO2 25 22 - 32 mmol/L   Glucose, Bld  97 70 - 99 mg/dL   BUN 5 (L) 6 - 20 mg/dL   Creatinine, Ser 6.56 0.44 - 1.00 mg/dL   Calcium 8.7 (L) 8.9 - 10.3 mg/dL   GFR calc non Af Amer >60 >60 mL/min   GFR calc Af Amer >60 >60 mL/min   Anion gap 10 5 - 15  Magnesium     Status: Abnormal   Collection Time: 01/10/20  8:30 AM  Result Value Ref Range   Magnesium 1.6 (L) 1.7 - 2.4 mg/dL  Phosphorus     Status: None   Collection Time: 01/10/20  8:30 AM  Result Value Ref Range   Phosphorus 3.6 2.5 - 4.6 mg/dL    Imaging: No results found.  Assessment and Plan: This is a 59 y.o. female with abdominal pain, nausea, and vomiting.  --Discussed with the patient that her symptoms do not particularly correlate with gallbladder etiologies, but looking at her records, and checking with the patient, she has not had a HIDA scan in the past.  I think it is reasonable to obtain this study to be able to exclude any biliary issues such as cholecystitis and also biliary dyskinesia.  At this time of day, nuclear medicine is closed, but will make patient NPO after midnight and order HIDA scan with EF to be done.  Informed her RN about not using narcotic pain medication tomorrow as well so it does not conflict with the study.  I have ordered Toradol to help with pain control as well. --The CT scan does show colonic wall thickening, but otherwise  there is no surrounding stranding.  Also, the patient is not having diarrhea and stool studies have not been done yet as she has not had a bowel movement yesterday or today.  I think colitis is less likely of a diagnosis.  However, as a precaution, would agree with continuing antibiotics.   --Would recommend GI consult as well to help with evaluation of her symptoms and possible workup that may still be needed.   Face-to-face time spent with the patient and care providers was 55 minutes, with more than 50% of the time spent counseling, educating, and coordinating care of the patient.     Howie Ill, MD [facility] Pg:  [phone]

## 2020-01-10 NOTE — Progress Notes (Signed)
Initial Nutrition Assessment  DOCUMENTATION CODES:   Underweight  INTERVENTION:   Boost Breeze po TID, each supplement provides 250 kcal and 9 grams of protein  MVI daily   NUTRITION DIAGNOSIS:   Inadequate oral intake related to acute illness as evidenced by per patient/family report.  GOAL:   Patient will meet greater than or equal to 90% of their needs  MONITOR:   PO intake, Supplement acceptance, Labs, Weight trends, Skin, I & O's  REASON FOR ASSESSMENT:   Other (Comment)(Low BMI)    ASSESSMENT:   59 y.o. female with medical history significant of sciatica, GERD, hypertension, anxiety disorder, recurrent abdominal pain with chronic pain syndrome who presented to the ER with nausea, vomiting and abdominal pain and found to have colitis.  RD working remotely.  Pt with poor appetite and oral intake since admit r/t nausea and abdominal pain. RD suspects pt with poor appetite and oral intake at baseline as pt is underweight. RD will add supplements and vitamins to help pt meet her estimated needs. Per chart, pt appears weight stable at baseline.   Pt is at high risk for malnutrition  Medications reviewed and include: lovenox, NaCl w/ KCl @75ml /hr, ciprofloxacin, metronidazole  Labs reviewed: Na 131(L), P 3.6 wnl, Mg 1.6(L) Wbc- 14.1(H)  Unable to complete Nutrition-Focused physical exam at this time.   Diet Order:   Diet Order            Diet clear liquid Room service appropriate? Yes; Fluid consistency: Thin  Diet effective now             EDUCATION NEEDS:   No education needs have been identified at this time  Skin:  Skin Assessment: Reviewed RN Assessment  Last BM:  2/15  Height:   Ht Readings from Last 1 Encounters:  01/09/20 5\' 2"  (1.575 m)    Weight:   Wt Readings from Last 1 Encounters:  01/09/20 43 kg    Ideal Body Weight:  50 kg  BMI:  Body mass index is 17.34 kg/m.  Estimated Nutritional Needs:   Kcal:   1300-1500kcal/day  Protein:  65-75g/day  Fluid:  >1.3L/day  MS, RD, LDN Contact information available in Amion

## 2020-01-11 ENCOUNTER — Inpatient Hospital Stay: Payer: Self-pay

## 2020-01-11 ENCOUNTER — Encounter: Payer: Self-pay | Admitting: Internal Medicine

## 2020-01-11 DIAGNOSIS — R1011 Right upper quadrant pain: Secondary | ICD-10-CM

## 2020-01-11 LAB — CBC WITH DIFFERENTIAL/PLATELET
Abs Immature Granulocytes: 0.08 10*3/uL — ABNORMAL HIGH (ref 0.00–0.07)
Basophils Absolute: 0 10*3/uL (ref 0.0–0.1)
Basophils Relative: 0 %
Eosinophils Absolute: 0 10*3/uL (ref 0.0–0.5)
Eosinophils Relative: 0 %
HCT: 35.3 % — ABNORMAL LOW (ref 36.0–46.0)
Hemoglobin: 12 g/dL (ref 12.0–15.0)
Immature Granulocytes: 1 %
Lymphocytes Relative: 13 %
Lymphs Abs: 1.5 10*3/uL (ref 0.7–4.0)
MCH: 28.2 pg (ref 26.0–34.0)
MCHC: 34 g/dL (ref 30.0–36.0)
MCV: 82.9 fL (ref 80.0–100.0)
Monocytes Absolute: 0.8 10*3/uL (ref 0.1–1.0)
Monocytes Relative: 7 %
Neutro Abs: 8.8 10*3/uL — ABNORMAL HIGH (ref 1.7–7.7)
Neutrophils Relative %: 79 %
Platelets: 545 10*3/uL — ABNORMAL HIGH (ref 150–400)
RBC: 4.26 MIL/uL (ref 3.87–5.11)
RDW: 17.2 % — ABNORMAL HIGH (ref 11.5–15.5)
WBC: 11.2 10*3/uL — ABNORMAL HIGH (ref 4.0–10.5)
nRBC: 0 % (ref 0.0–0.2)

## 2020-01-11 LAB — BASIC METABOLIC PANEL
Anion gap: 8 (ref 5–15)
BUN: 5 mg/dL — ABNORMAL LOW (ref 6–20)
CO2: 23 mmol/L (ref 22–32)
Calcium: 8.3 mg/dL — ABNORMAL LOW (ref 8.9–10.3)
Chloride: 105 mmol/L (ref 98–111)
Creatinine, Ser: 0.54 mg/dL (ref 0.44–1.00)
GFR calc Af Amer: >60 mL/min (ref >60)
GFR calc non Af Amer: >60 mL/min (ref >60)
Glucose, Bld: 109 mg/dL — ABNORMAL HIGH (ref 70–99)
Potassium: 3.7 mmol/L (ref 3.5–5.1)
Sodium: 136 mmol/L (ref 135–145)

## 2020-01-11 LAB — MAGNESIUM: Magnesium: 2 mg/dL (ref 1.7–2.4)

## 2020-01-11 MED ORDER — TECHNETIUM TC 99M MEBROFENIN IV KIT
5.0000 | PACK | Freq: Once | INTRAVENOUS | Status: AC | PRN
  Administered 2020-01-11: 5.37 via INTRAVENOUS

## 2020-01-11 MED ORDER — KCL IN DEXTROSE-NACL 20-5-0.9 MEQ/L-%-% IV SOLN
INTRAVENOUS | Status: AC
  Filled 2020-01-11 (×3): qty 1000

## 2020-01-11 MED ORDER — SINCALIDE 5 MCG IJ SOLR
0.0200 ug/kg | Freq: Once | INTRAMUSCULAR | Status: AC
  Administered 2020-01-11: 0.9 ug via INTRAVENOUS

## 2020-01-11 NOTE — Progress Notes (Signed)
No biliary pathology identified on HIDA scan.  No indication for cholecystectomy.  General surgery will sign off.

## 2020-01-11 NOTE — Plan of Care (Signed)
Continuing with plan of care. 

## 2020-01-11 NOTE — Progress Notes (Signed)
I went into the room to see her, she was not gone for HIDA scan at 3:30 PM.  I will handover to Dr Servando Snare who will see her tomorrow  Dr Wyline Mood MD,MRCP [facility]) Gastroenterology/Hepatology Pager: [phone]

## 2020-01-11 NOTE — Progress Notes (Signed)
[facility] SURGICAL PROGRESS NOTE (cpt [phone])  Hospital Day(s): 2.   Interval History: Patient seen and examined, no acute events or new complaints overnight. Patient reports that she continues to have abdominal pain. This is primarily at the epigastrium and RUQ. Sharp pain. Somewhat improved from yesterday. Associated nausea. No fever, chills, or emesis. She has been fasting. Labs are pending. Plan for HIDA today.  Review of Systems:  Constitutional: denies fever, chills  HEENT: denies cough or congestion  Respiratory: denies any shortness of breath  Cardiovascular: denies chest pain or palpitations  Gastrointestinal: + abdominal pain, + Nausea, Denied Vomiting, or diarrhea/and bowel function as per interval history Genitourinary: denies burning with urination or urinary frequency   Vital signs in last 24 hours: [min-max] current  Temp:  [97.8 F (36.6 C)-99.1 F (37.3 C)] 97.8 F (36.6 C) (02/19 0352) Pulse Rate:  [90-100] 90 (02/19 0352) Resp:  [18-20] 20 (02/19 0352) BP: (112-163)/(72-90) 163/80 (02/19 0352) SpO2:  [95 %-98 %] 98 % (02/19 0352)     Height: 5\' 2"  (157.5 cm) Weight: 43 kg BMI (Calculated): 17.33   Intake/Output last 2 shifts:  02/18 0701 - 02/19 0700 In: 2715.8 [I.V.:2000; IV Piggyback:715.8] Out: -    Physical Exam:  Constitutional: alert, cooperative and no distress  HENT: normocephalic without obvious abnormality  Eyes: PERRL, EOM's grossly intact and symmetric   Respiratory: breathing non-labored at rest  Cardiovascular: regular rate and sinus rhythm  Gastrointestinal: soft, tenderness in the epigastrium and RUQ (mild), and non-distended. No rebound/guarding Musculoskeletal: no edema or wounds, motor and sensation grossly intact, NT    Labs:  CBC Latest Ref Rng & Units 01/11/2020 01/10/2020 01/09/2020  WBC 4.0 - 10.5 K/uL 11.2(H) 14.1(H) 15.0(H)  Hemoglobin 12.0 - 15.0 g/dL 01/11/2020 14.9 11.6(L)  Hematocrit 36.0 - 46.0 % 35.3(L) 37.0  34.6(L)  Platelets 150 - 400 K/uL 545(H) 600(H) 680(H)   CMP Latest Ref Rng & Units 01/11/2020 01/10/2020 01/09/2020  Glucose 70 - 99 mg/dL 01/11/2020) 97 637(C)  BUN 6 - 20 mg/dL 5(L) 5(L) 5(L)  Creatinine 0.44 - 1.00 mg/dL 588(F 0.27 7.41  Sodium 135 - 145 mmol/L 136 131(L) 136  Potassium 3.5 - 5.1 mmol/L 3.7 3.2(L) 4.0  Chloride 98 - 111 mmol/L 105 96(L) 102  CO2 22 - 32 mmol/L 23 25 23   Calcium 8.9 - 10.3 mg/dL 8.3(L) 8.7(L) 9.1  Total Protein 6.5 - 8.1 g/dL - - 6.7  Total Bilirubin 0.3 - 1.2 mg/dL - - 0.6  Alkaline Phos 38 - 126 U/L - - 109  AST 15 - 41 U/L - - 17  ALT 0 - 44 U/L - - 9    Imaging studies: No new pertinent imaging studies; HIDA Pending   Assessment/Plan: (ICD-10's: R10.11) 59 y.o. female with abdominal pain and nausea of uncertain etiology   - NPO while awaiting HIDA  - IVF Resuscitation  - Continue IV ABx (Cipro + Flagyl) for possible colitis; less likely; await stool studies   - Will plan on obtaining HIDA to rule out cholecystitis vs biliary dyskinesia as etiology of symptoms  - Pain control prn (AVOID Narcotics); Antiemetics prn  - Monitor abdominal examination; on-going bowel function  - Follow up labs  - GI Consult pending   - No emergent surgical intervention; will follow HIDA results   - Further management per primary team   All of the above findings and recommendations were discussed with the patient, and the medical team, and all of patient's questions were  answered to her expressed satisfaction.  -- Edison Simon, PA-C [facility] 01/11/2020, 8:43 AM [phone] M-F: 7am - 4pm

## 2020-01-11 NOTE — Progress Notes (Signed)
Progress Note    Deborah Griffin  WFU:[id number] DOB: [birth year]  DOA: 01/08/2020 PCP: Armando Gang, FNP      Brief Narrative:    Medical records reviewed and are as summarized below:  Deborah Griffin is an 59 y.o. female with medical history significant of sciatica, GERD, hypertension, anxiety disorder, recurrent abdominal pain with chronic pain syndrome who presented to the ER with 9 out of 10 central abdominal pain.  Pain was sharp and occasionally colicky.  He was hesitant with nausea and vomiting.  CT abdomen pelvis showed acute colitis.      Assessment/Plan:   Principal Problem:   Acute colitis Active Problems:   Anxiety   Low back pain with sciatica   GERD (gastroesophageal reflux disease)   Essential hypertension   Hyponatremia   Hypomagnesemia   Hypokalemia   Acute colitis/abdominal pain/vomiting/leukocytosis: N.p.o. for now. Continue empiric IV antibiotics (Cipro and Flagyl).  Patient has been evaluated by the general surgeon and plan for HIDA scan today has been noted.  Gastroenterologist has also been consulted.  Hyponatremia: Improved.  Hypokalemia and hypomagnesemia: Improved.  Continue to monitor.  Thrombocytosis: Improving.  Likely reactive.  Hypertension: Continue lisinopril  GERD with history of esophagitis and gastritis noted on EGD in July 2019: Continue IV Protonix  Anxiety: Continue Xanax as needed.  Of note, patient also has chronic/recurrent abdominal pain but this abdominal pain is acute and different.  Patient has had extensive work-up in the past for recurrent nausea, vomiting and abdominal pain.  She was seen by [facility] gastroenterology for this.  Differential diagnosis considered according to chart review included functional nausea/vomiting, gastroparesis, cyclic vomiting syndrome.    Body mass index is 17.34 kg/m.  (Underweight)   Family Communication/Anticipated D/C date and plan/Code Status   DVT prophylaxis: Lovenox Code  Status: Full code Family Communication: Plan discussed with patient Disposition Plan: Patient is from home.  Possible discharge to home in 1 to 2 days if vomiting, abdominal pain, nausea and leukocytosis improve and she is able to tolerate a diet.      Subjective:   She complains of dry heaves but no vomiting.  She still has upper abdominal pain.  No diarrhea.  She is unable to tolerate a liquid diet.  She said she vomits when she takes food by mouth.  Objective:    Vitals:   01/10/20 1909 01/10/20 2039 01/11/20 0352 01/11/20 0855  BP: 112/72 (!) 153/90 (!) 163/80 (!) 171/96  Pulse: 97 100 90   Resp: 18 20 20    Temp: 98.3 F (36.8 C) 99.1 F (37.3 C) 97.8 F (36.6 C)   TempSrc: Oral Oral Oral   SpO2: 95% 98% 98%   Weight:      Height:        Intake/Output Summary (Last 24 hours) at 01/11/2020 1333 Last data filed at 01/11/2020 1100 Gross per 24 hour  Intake 2342.94 ml  Output --  Net 2342.94 ml   Filed Weights   01/08/20 2110 01/09/20 1808  Weight: 45.4 kg 43 kg    Exam:  GEN: NAD, cachectic SKIN: No rash EYES: No pallor or icterus ENT: MMM CV: RRR PULM: CTA B ABD: soft, ND, she still has epigastric tenderness but no rebound tenderness or guarding, +BS CNS: AAO x 3, non focal EXT: No edema or tenderness    Data Reviewed:   I have personally reviewed following labs and imaging studies:  Labs: Labs show the following:   Basic  Metabolic Panel: Recent Labs  Lab 01/08/20 2115 01/08/20 2115 01/09/20 0619 01/09/20 0619 01/10/20 0830 01/11/20 0327  NA 136  --  136  --  131* 136  K 3.5   < > 4.0   < > 3.2* 3.7  CL 103  --  102  --  96* 105  CO2 20*  --  23  --  25 23  GLUCOSE 178*  --  141*  --  97 109*  BUN 6  --  5*  --  5* 5*  CREATININE 0.57  --  0.51  --  0.55 0.54  CALCIUM 9.4  --  9.1  --  8.7* 8.3*  MG  --   --   --   --  1.6* 2.0  PHOS  --   --   --   --  3.6  --    < > = values in this interval not displayed.   GFR Estimated  Creatinine Clearance: 52.7 mL/min (by C-G formula based on SCr of 0.54 mg/dL). Liver Function Tests: Recent Labs  Lab 01/08/20 2115 01/09/20 0619  AST 18 17  ALT 9 9  ALKPHOS 118 109  BILITOT 0.7 0.6  PROT 7.1 6.7  ALBUMIN 3.9 3.6   Recent Labs  Lab 01/08/20 2115  LIPASE 33   No results for input(s): AMMONIA in the last 168 hours. Coagulation profile No results for input(s): INR, PROTIME in the last 168 hours.  CBC: Recent Labs  Lab 01/08/20 2115 01/09/20 0619 01/10/20 0830 01/11/20 0327  WBC 9.2 15.0* 14.1* 11.2*  NEUTROABS 7.1  --  11.2* 8.8*  HGB 12.4 11.6* 12.5 12.0  HCT 36.6 34.6* 37.0 35.3*  MCV 82.2 84.0 82.2 82.9  PLT 719* 680* 600* 545*   Cardiac Enzymes: No results for input(s): CKTOTAL, CKMB, CKMBINDEX, TROPONINI in the last 168 hours. BNP (last 3 results) No results for input(s): PROBNP in the last 8760 hours. CBG: No results for input(s): GLUCAP in the last 168 hours. D-Dimer: No results for input(s): DDIMER in the last 72 hours. Hgb A1c: Recent Labs    01/09/20 0103  HGBA1C 5.3   Lipid Profile: No results for input(s): CHOL, HDL, LDLCALC, TRIG, CHOLHDL, LDLDIRECT in the last 72 hours. Thyroid function studies: No results for input(s): TSH, T4TOTAL, T3FREE, THYROIDAB in the last 72 hours.  Invalid input(s): FREET3 Anemia work up: No results for input(s): VITAMINB12, FOLATE, FERRITIN, TIBC, IRON, RETICCTPCT in the last 72 hours. Sepsis Labs: Recent Labs  Lab 01/08/20 2115 01/08/20 2334 01/09/20 0619 01/10/20 0830 01/11/20 0327  WBC 9.2  --  15.0* 14.1* 11.2*  LATICACIDVEN 1.7 1.1  --   --   --     Microbiology Recent Results (from the past 240 hour(s))  SARS CORONAVIRUS 2 (TAT 6-24 HRS) Nasopharyngeal Nasopharyngeal Swab     Status: None   Collection Time: 01/09/20  1:03 AM   Specimen: Nasopharyngeal Swab  Result Value Ref Range Status   SARS Coronavirus 2 NEGATIVE NEGATIVE Final    Comment: (NOTE) SARS-CoV-2 target nucleic  acids are NOT DETECTED. The SARS-CoV-2 RNA is generally detectable in upper and lower respiratory specimens during the acute phase of infection. Negative results do not preclude SARS-CoV-2 infection, do not rule out co-infections with other pathogens, and should not be used as the sole basis for treatment or other patient management decisions. Negative results must be combined with clinical observations, patient history, and epidemiological information. The expected result is Negative. Fact Sheet for Patients:  HairSlick.no Fact Sheet for Healthcare Providers: quierodirigir.com This test is not yet approved or cleared by the [place] FDA and  has been authorized for detection and/or diagnosis of SARS-CoV-2 by FDA under an Emergency Use Authorization (EUA). This EUA will remain  in effect (meaning this test can be used) for the duration of the COVID-19 declaration under Section 56 4(b)(1) of the Act, 21 U.S.C. section 360bbb-3(b)(1), unless the authorization is terminated or revoked sooner. Performed at [facility], [address], [place], [place] [address]     Procedures and diagnostic studies:  No results found.  Medications:   . ALPRAZolam  0.5 mg Oral BID  . enoxaparin (LOVENOX) injection  40 mg Subcutaneous Q24H  . feeding supplement  1 Container Oral TID BM  . lisinopril  20 mg Oral Daily  . multivitamin with minerals  1 tablet Oral Daily  . pantoprazole (PROTONIX) IV  40 mg Intravenous Q24H   Continuous Infusions: . 0.9 % NaCl with KCl 40 mEq / L 75 mL/hr at 01/11/20 1100  . ciprofloxacin 400 mg (01/11/20 1059)  . metronidazole Stopped (01/11/20 0959)     LOS: 2 days   Taray Normoyle  [facility]     01/11/2020, 1:33 PM

## 2020-01-12 DIAGNOSIS — R112 Nausea with vomiting, unspecified: Secondary | ICD-10-CM

## 2020-01-12 LAB — CBC WITH DIFFERENTIAL/PLATELET
Abs Immature Granulocytes: 0.04 10*3/uL (ref 0.00–0.07)
Basophils Absolute: 0 10*3/uL (ref 0.0–0.1)
Basophils Relative: 0 %
Eosinophils Absolute: 0 10*3/uL (ref 0.0–0.5)
Eosinophils Relative: 0 %
HCT: 35.4 % — ABNORMAL LOW (ref 36.0–46.0)
Hemoglobin: 11.9 g/dL — ABNORMAL LOW (ref 12.0–15.0)
Immature Granulocytes: 1 %
Lymphocytes Relative: 29 %
Lymphs Abs: 2.4 10*3/uL (ref 0.7–4.0)
MCH: 27.7 pg (ref 26.0–34.0)
MCHC: 33.6 g/dL (ref 30.0–36.0)
MCV: 82.5 fL (ref 80.0–100.0)
Monocytes Absolute: 0.8 10*3/uL (ref 0.1–1.0)
Monocytes Relative: 10 %
Neutro Abs: 4.8 10*3/uL (ref 1.7–7.7)
Neutrophils Relative %: 60 %
Platelets: 471 10*3/uL — ABNORMAL HIGH (ref 150–400)
RBC: 4.29 MIL/uL (ref 3.87–5.11)
RDW: 17.1 % — ABNORMAL HIGH (ref 11.5–15.5)
WBC: 8.2 10*3/uL (ref 4.0–10.5)
nRBC: 0 % (ref 0.0–0.2)

## 2020-01-12 LAB — BASIC METABOLIC PANEL
Anion gap: 12 (ref 5–15)
BUN: <5 mg/dL — ABNORMAL LOW (ref 6–20)
CO2: 23 mmol/L (ref 22–32)
Calcium: 8.4 mg/dL — ABNORMAL LOW (ref 8.9–10.3)
Chloride: 99 mmol/L (ref 98–111)
Creatinine, Ser: 0.49 mg/dL (ref 0.44–1.00)
GFR calc Af Amer: >60 mL/min (ref >60)
GFR calc non Af Amer: >60 mL/min (ref >60)
Glucose, Bld: 110 mg/dL — ABNORMAL HIGH (ref 70–99)
Potassium: 3.7 mmol/L (ref 3.5–5.1)
Sodium: 134 mmol/L — ABNORMAL LOW (ref 135–145)

## 2020-01-12 MED ORDER — PANTOPRAZOLE SODIUM 40 MG PO TBEC: 40.0000 mg | DELAYED_RELEASE_TABLET | Freq: Two times a day (BID) | ORAL | Status: DC

## 2020-01-12 MED ORDER — CYCLOBENZAPRINE HCL 10 MG PO TABS
5.0000 mg | ORAL_TABLET | Freq: Three times a day (TID) | ORAL | Status: DC
  Administered 2020-01-12 – 2020-01-13 (×3): 5 mg via ORAL
  Filled 2020-01-12 (×3): qty 1

## 2020-01-12 MED ORDER — DESIPRAMINE HCL 50 MG PO TABS
25.0000 mg | ORAL_TABLET | Freq: Every day | ORAL | Status: DC
  Administered 2020-01-12: 25 mg via ORAL
  Filled 2020-01-12: qty 1

## 2020-01-12 MED ORDER — PANTOPRAZOLE SODIUM 40 MG PO TBEC
40.0000 mg | DELAYED_RELEASE_TABLET | Freq: Two times a day (BID) | ORAL | Status: DC
  Administered 2020-01-12 – 2020-01-13 (×2): 40 mg via ORAL
  Filled 2020-01-12 (×2): qty 1

## 2020-01-12 NOTE — Consult Note (Signed)
Deborah Lame, MD [facility]  [address] [place], [place] [address] Phone: [phone] Fax : [phone]  Consultation  Referring Provider:     Dr. Jonelle Sidle Primary Care Physician:  Remi Haggard, FNP Primary Gastroenterologist: [facility]         Reason for Consultation:     Abdominal pain  Date of Admission:  01/08/2020 Date of Consultation:  01/12/2020         HPI:   Deborah Griffin is a 59 y.o. female was admitted with abdominal pain.  The patient has had this abdominal pain in the past and had a work-up at [facility].  The patient also had a CT scan on this admission.  The patient underwent an upper endoscopy at [facility] that showed:  Impression: - No obvious cause of dysphagia or intractable nausea and vomiting. - LA Grade B esophagitis. - Widely patent Schatzki ring. This is not a cause of dysphagia. - Erythematous mucosa in the prepyloric region of the stomach. - Duodenitis.  The patient CT scan in the ED this admission showed:  Possible diffuse colon wall thickening. IMPRESSION: 1. Collapsed appearance versus diffuse colitis type changes of the colon. 2. Possible small stones at the gallbladder fundus. 3. Otherwise no CT evidence for acute intra-abdominal or pelvic abnormality.  The patient was seen by surgery for possible gallbladder disease with a normal nuclear medicine and HIDA scan and surgery has signed off. The patient's lab work showed her white cell count to be elevated on February 17 at 15 that has come down to 8.2 today.  The patient also had a CT scan of the abdomen back in 2018 for abdominal pain at that time.  The only finding was mild ductal dilatation of the common bile duct without intrahepatic or ductal dilatation or obstruction seen.  The patient denies following up with GI after her multiple hospital admissions stating that she thought that they should have solved her problems while she was in the hospital while they have access to all of the testing equipment  there. The patient also denies going home on any medication for reflux. She states that she has the symptoms every 4 to 6 weeks with no symptoms between those episodes. She only comes to the hospital when the symptoms are so bad that she can't keep any food down. She has gotten to the point where she has lost weight where she has weighed 77 lbs. She now reports that she is close to 100.  Past Medical History:  Diagnosis Date  . Anxiety   . Lung collapse   . Sciatica     History reviewed. No pertinent surgical history.  Prior to Admission medications   Medication Sig Start Date End Date Taking? Authorizing Provider  ALPRAZolam Duanne Moron) 0.5 MG tablet Take 1 tablet (0.5 mg total) by mouth 2 (two) times daily. 01/15/16  Yes Krebs, Amy Lauren, NP  traMADol (ULTRAM) 50 MG tablet Take 50 mg by mouth 2 (two) times daily. 07/03/18  Yes [provider]  escitalopram (LEXAPRO) 5 MG tablet Take 5 mg by mouth daily. 05/15/18   [provider]  lisinopril (PRINIVIL,ZESTRIL) 20 MG tablet Take 20 mg by mouth daily. 06/28/18   [provider]  naproxen (NAPROSYN) 500 MG tablet Take 1 tablet (500 mg total) by mouth 2 (two) times daily with a meal. Patient not taking: Reported on 07/11/2018 01/15/16   Luciana Axe, NP  pantoprazole (PROTONIX) 40 MG tablet Take 40 mg by  mouth 2 (two) times daily. 07/02/18   [provider]    Family History  Problem Relation Age of Onset  . Cancer Mother      Social History   Tobacco Use  . Smoking status: Current Every Day Smoker    Packs/day: 0.50  . Smokeless tobacco: Never Used  Substance Use Topics  . Alcohol use: No  . Drug use: No    Allergies as of 01/08/2020 - Review Complete 01/08/2020  Allergen Reaction Noted  . Penicillins  01/16/2016    Review of Systems:    All systems reviewed and negative except where noted in HPI.   Physical Exam:  Vital signs in last 24 hours: Temp:  [97.9 F (36.6 C)-98.7 F (37.1 C)]  98.7 F (37.1 C) (02/20 0442) Pulse Rate:  [90-93] 93 (02/20 0442) Resp:  [16-17] 17 (02/20 0442) BP: (141-177)/(83-91) 141/83 (02/20 0442) SpO2:  [95 %-99 %] 99 % (02/20 0442) Last BM Date: 01/09/20 General:   Pleasant, cooperative in NAD Head:  Normocephalic and atraumatic. Eyes:   No icterus.   Conjunctiva pink. PERRLA. Ears:  Normal auditory acuity. Neck:  Supple; no masses or thyroidomegaly Lungs: Respirations even and unlabored. Lungs clear to auscultation bilaterally.   No wheezes, crackles, or rhonchi.  Heart:  Regular rate and rhythm;  Without murmur, clicks, rubs or gallops Abdomen:  Soft, nondistended, positive tenderness to 1 finger palpation while flexing the abdominal wall muscles by lifting the leg 6 in above the exam table. Normal bowel sounds. No appreciable masses or hepatomegaly.  No rebound or guarding.  Rectal:  Not performed. Msk:  Symmetrical without gross deformities.    Extremities:  Without edema, cyanosis or clubbing. Neurologic:  Alert and oriented x3;  grossly normal neurologically. Skin:  Intact without significant lesions or rashes. Cervical Nodes:  No significant cervical adenopathy. Psych:  Alert and cooperative. Normal affect.  LAB RESULTS: Recent Labs    01/10/20 0830 01/11/20 0327 01/12/20 0436  WBC 14.1* 11.2* 8.2  HGB 12.5 12.0 11.9*  HCT 37.0 35.3* 35.4*  PLT 600* 545* 471*   BMET Recent Labs    01/10/20 0830 01/11/20 0327 01/12/20 0436  NA 131* 136 134*  K 3.2* 3.7 3.7  CL 96* 105 99  CO2 25 23 23   GLUCOSE 97 109* 110*  BUN 5* 5* <5*  CREATININE 0.55 0.54 0.49  CALCIUM 8.7* 8.3* 8.4*   LFT No results for input(s): PROT, ALBUMIN, AST, ALT, ALKPHOS, BILITOT, BILIDIR, IBILI in the last 72 hours. PT/INR No results for input(s): LABPROT, INR in the last 72 hours.  STUDIES: NM Hepato W/EF  Result Date: 01/11/2020 CLINICAL DATA:  Abdominal pain for the past 2 years. Nausea and vomiting. EXAM: NUCLEAR MEDICINE HEPATOBILIARY  IMAGING WITH GALLBLADDER EF TECHNIQUE: Sequential images of the abdomen were obtained out to 60 minutes following intravenous administration of radiopharmaceutical. After slow intravenous infusion of 0.86 micrograms Cholecystokinin, gallbladder ejection fraction was determined. RADIOPHARMACEUTICALS:  5.37 mCi Tc-21m Choletec IV COMPARISON:  CT, 01/08/2020 FINDINGS: Prompt uptake and biliary excretion of activity by the liver is seen. Gallbladder activity is visualized, consistent with patency of cystic duct. Biliary activity passes into small bowel, consistent with patent common bile duct. Calculated gallbladder ejection fraction is 83%. (At 60 min, normal ejection fraction is greater than 40%.) IMPRESSION: Normal hepatobiliary imaging with normal gallbladder function. Electronically Signed   By: 01/10/2020 M.D.   On: 01/11/2020 16:50      Impression / Plan:   Assessment:  Principal Problem:   Acute colitis Active Problems:   Anxiety   Low back pain with sciatica   GERD (gastroesophageal reflux disease)   Essential hypertension   Hyponatremia   Hypomagnesemia   Hypokalemia   Deborah Griffin is a 59 y.o. y/o female with a history of GERD and a CT scan showing possible thickened colon consistent with poor distention versus colitis. The patient has absolutely no symptoms of colitis. She is not having any lower abdominal pain, diarrhea or rectal bleeding. The patient's abdominal pain is clearly musculoskeletal and reproducible by lifting the patient's legs above the exam table while palpating lightly with 1 finger over the abdominal wall muscles. As far as what is causing her nausea and vomiting and whether it is the nausea and vomiting causing the abdominal wall pain or the abdominal pain causing the nausea and vomiting.    Plan:  If the patient is having a repeat of her esophagitis I think that the patient should be on long-term PPI to avoid future hospitalizations. In addition to this and her  meeting the Rome IV criteria a functional bowel disorder I think starting a low-dose tricyclic antidepressants at night for IBS is reasonable. I'll also start the patient on Flexeril for her abdominal muscular pain. The patient has been explained the plan and agrees with it.   Thank you for involving me in the care of this patient.      LOS: 3 days   Midge Minium, MD  01/12/2020, 10:50 AM Pager [phone] 7am-5pm  Check AMION for 5pm -7am coverage and on weekends   Note: This dictation was prepared with Dragon dictation along with smaller phrase technology. Any transcriptional errors that result from this process are unintentional.

## 2020-01-12 NOTE — Progress Notes (Signed)
Progress Note    Deborah Griffin  KXF:[id number] DOB: 02/20/1961  DOA: 01/08/2020 PCP: Remi Haggard, FNP      Brief Narrative:    Medical records reviewed and are as summarized below:  Deborah Griffin is an 59 y.o. female with medical history significant of sciatica, GERD, hypertension, anxiety disorder, recurrent abdominal pain with chronic pain syndrome who presented to the ER with 9 out of 10 central abdominal pain.  Pain was sharp and occasionally colicky.  He was hesitant with nausea and vomiting.  CT abdomen pelvis showed acute colitis.      Assessment/Plan:   Principal Problem:   Acute colitis Active Problems:   Anxiety   Low back pain with sciatica   GERD (gastroesophageal reflux disease)   Essential hypertension   Hyponatremia   Hypomagnesemia   Hypokalemia   Acute colitis/abdominal pain/vomiting/leukocytosis: Advance diet from clear to full liquid diet.  Continue empiric IV antibiotics (Cipro and Flagyl).  HIDA scan did not show any biliary or gallbladder pathology.  General surgery has signed off.  GI consult is pending.  Continue IV fluids but will discontinue later today.  Hyponatremia: Improved.  Hypokalemia and hypomagnesemia: Improved.  Continue to monitor.  Thrombocytosis: Improving.  Likely reactive.  Hypertension: Continue lisinopril  GERD with history of esophagitis and gastritis noted on EGD in July 2019: Continue IV Protonix  Anxiety: Continue Xanax as needed.  Of note, patient also has chronic/recurrent abdominal pain but this abdominal pain is acute and different.  Patient has had extensive work-up in the past for recurrent nausea, vomiting and abdominal pain.  She was seen by [facility] gastroenterology for this.  Differential diagnosis considered according to chart review included functional nausea/vomiting, gastroparesis, cyclic vomiting syndrome.    Body mass index is 17.34 kg/m.  (Underweight)   Family Communication/Anticipated D/C  date and plan/Code Status   DVT prophylaxis: Lovenox Code Status: Full code Family Communication: Plan discussed with patient Disposition Plan: Patient is from home.  Possible discharge to home tomorrow if vomiting, abdominal pain, nausea and leukocytosis improve and she is able to tolerate a diet.      Subjective:   No complaints.  She feels a little better today.  Abdominal pain is better.  No vomiting.  She only has some mild nausea.  She tolerated some liquids but she is drinking very slowly.  Objective:    Vitals:   01/11/20 0855 01/11/20 1805 01/11/20 1927 01/12/20 0442  BP: (!) 171/96 (!) 177/87 (!) 175/91 (!) 141/83  Pulse:  90 92 93  Resp:  16 16 17   Temp:   97.9 F (36.6 C) 98.7 F (37.1 C)  TempSrc:  Oral Oral Oral  SpO2:  95% 97% 99%  Weight:      Height:        Intake/Output Summary (Last 24 hours) at 01/12/2020 1203 Last data filed at 01/12/2020 1000 Gross per 24 hour  Intake 2451.12 ml  Output 300 ml  Net 2151.12 ml   Filed Weights   01/08/20 2110 01/09/20 1808  Weight: 45.4 kg 43 kg    Exam:  GEN: NAD, cachectic SKIN: No rash EYES: No pallor or icterus ENT: MMM CV: RRR PULM: No wheezing or rales heard ABD: soft, ND, epigastric tenderness has improved, +BS  CNS: AAO x 3, non focal EXT: No edema or tenderness    Data Reviewed:   I have personally reviewed following labs and imaging studies:  Labs: Labs show the following:  Basic Metabolic Panel: Recent Labs  Lab 01/08/20 2115 01/08/20 2115 01/09/20 0619 01/09/20 0619 01/10/20 0830 01/10/20 0830 01/11/20 0327 01/12/20 0436  NA 136  --  136  --  131*  --  136 134*  K 3.5   < > 4.0   < > 3.2*   < > 3.7 3.7  CL 103  --  102  --  96*  --  105 99  CO2 20*  --  23  --  25  --  23 23  GLUCOSE 178*  --  141*  --  97  --  109* 110*  BUN 6  --  5*  --  5*  --  5* <5*  CREATININE 0.57  --  0.51  --  0.55  --  0.54 0.49  CALCIUM 9.4  --  9.1  --  8.7*  --  8.3* 8.4*  MG  --   --   --    --  1.6*  --  2.0  --   PHOS  --   --   --   --  3.6  --   --   --    < > = values in this interval not displayed.   GFR Estimated Creatinine Clearance: 52.7 mL/min (by C-G formula based on SCr of 0.49 mg/dL). Liver Function Tests: Recent Labs  Lab 01/08/20 2115 01/09/20 0619  AST 18 17  ALT 9 9  ALKPHOS 118 109  BILITOT 0.7 0.6  PROT 7.1 6.7  ALBUMIN 3.9 3.6   Recent Labs  Lab 01/08/20 2115  LIPASE 33   No results for input(s): AMMONIA in the last 168 hours. Coagulation profile No results for input(s): INR, PROTIME in the last 168 hours.  CBC: Recent Labs  Lab 01/08/20 2115 01/09/20 0619 01/10/20 0830 01/11/20 0327 01/12/20 0436  WBC 9.2 15.0* 14.1* 11.2* 8.2  NEUTROABS 7.1  --  11.2* 8.8* 4.8  HGB 12.4 11.6* 12.5 12.0 11.9*  HCT 36.6 34.6* 37.0 35.3* 35.4*  MCV 82.2 84.0 82.2 82.9 82.5  PLT 719* 680* 600* 545* 471*   Cardiac Enzymes: No results for input(s): CKTOTAL, CKMB, CKMBINDEX, TROPONINI in the last 168 hours. BNP (last 3 results) No results for input(s): PROBNP in the last 8760 hours. CBG: No results for input(s): GLUCAP in the last 168 hours. D-Dimer: No results for input(s): DDIMER in the last 72 hours. Hgb A1c: No results for input(s): HGBA1C in the last 72 hours. Lipid Profile: No results for input(s): CHOL, HDL, LDLCALC, TRIG, CHOLHDL, LDLDIRECT in the last 72 hours. Thyroid function studies: No results for input(s): TSH, T4TOTAL, T3FREE, THYROIDAB in the last 72 hours.  Invalid input(s): FREET3 Anemia work up: No results for input(s): VITAMINB12, FOLATE, FERRITIN, TIBC, IRON, RETICCTPCT in the last 72 hours. Sepsis Labs: Recent Labs  Lab 01/08/20 2115 01/08/20 2115 01/08/20 2334 01/09/20 0619 01/10/20 0830 01/11/20 0327 01/12/20 0436  WBC 9.2   < >  --  15.0* 14.1* 11.2* 8.2  LATICACIDVEN 1.7  --  1.1  --   --   --   --    < > = values in this interval not displayed.    Microbiology Recent Results (from the past 240 hour(s))   SARS CORONAVIRUS 2 (TAT 6-24 HRS) Nasopharyngeal Nasopharyngeal Swab     Status: None   Collection Time: 01/09/20  1:03 AM   Specimen: Nasopharyngeal Swab  Result Value Ref Range Status   SARS Coronavirus 2 NEGATIVE NEGATIVE Final  Comment: (NOTE) SARS-CoV-2 target nucleic acids are NOT DETECTED. The SARS-CoV-2 RNA is generally detectable in upper and lower respiratory specimens during the acute phase of infection. Negative results do not preclude SARS-CoV-2 infection, do not rule out co-infections with other pathogens, and should not be used as the sole basis for treatment or other patient management decisions. Negative results must be combined with clinical observations, patient history, and epidemiological information. The expected result is Negative. Fact Sheet for Patients: HairSlick.no Fact Sheet for Healthcare Providers: quierodirigir.com This test is not yet approved or cleared by the [place] FDA and  has been authorized for detection and/or diagnosis of SARS-CoV-2 by FDA under an Emergency Use Authorization (EUA). This EUA will remain  in effect (meaning this test can be used) for the duration of the COVID-19 declaration under Section 56 4(b)(1) of the Act, 21 U.S.C. section 360bbb-3(b)(1), unless the authorization is terminated or revoked sooner. Performed at [facility], [address], [place], [place] [address]     Procedures and diagnostic studies:  NM Hepato W/EF  Result Date: 01/11/2020 CLINICAL DATA:  Abdominal pain for the past 2 years. Nausea and vomiting. EXAM: NUCLEAR MEDICINE HEPATOBILIARY IMAGING WITH GALLBLADDER EF TECHNIQUE: Sequential images of the abdomen were obtained out to 60 minutes following intravenous administration of radiopharmaceutical. After slow intravenous infusion of 0.86 micrograms Cholecystokinin, gallbladder ejection fraction was determined. RADIOPHARMACEUTICALS:  5.37  mCi Tc-6m Choletec IV COMPARISON:  CT, 01/08/2020 FINDINGS: Prompt uptake and biliary excretion of activity by the liver is seen. Gallbladder activity is visualized, consistent with patency of cystic duct. Biliary activity passes into small bowel, consistent with patent common bile duct. Calculated gallbladder ejection fraction is 83%. (At 60 min, normal ejection fraction is greater than 40%.) IMPRESSION: Normal hepatobiliary imaging with normal gallbladder function. Electronically Signed   By: Amie Portland M.D.   On: 01/11/2020 16:50    Medications:   . ALPRAZolam  0.5 mg Oral BID  . enoxaparin (LOVENOX) injection  40 mg Subcutaneous Q24H  . feeding supplement  1 Container Oral TID BM  . lisinopril  20 mg Oral Daily  . multivitamin with minerals  1 tablet Oral Daily  . pantoprazole (PROTONIX) IV  40 mg Intravenous Q24H   Continuous Infusions: . ciprofloxacin Stopped (01/12/20 0948)  . dextrose 5 % and 0.9 % NaCl with KCl 20 mEq/L 75 mL/hr at 01/12/20 1000  . metronidazole Stopped (01/12/20 0152)     LOS: 3 days   Ranbir Chew  [facility]     01/12/2020, 12:03 PM

## 2020-01-12 NOTE — Plan of Care (Signed)
Continuing with plan of care. 

## 2020-01-13 LAB — BASIC METABOLIC PANEL
Anion gap: 6 (ref 5–15)
BUN: <5 mg/dL — ABNORMAL LOW (ref 6–20)
CO2: 24 mmol/L (ref 22–32)
Calcium: 8.3 mg/dL — ABNORMAL LOW (ref 8.9–10.3)
Chloride: 106 mmol/L (ref 98–111)
Creatinine, Ser: 0.57 mg/dL (ref 0.44–1.00)
GFR calc Af Amer: >60 mL/min (ref >60)
GFR calc non Af Amer: >60 mL/min (ref >60)
Glucose, Bld: 97 mg/dL (ref 70–99)
Potassium: 3.3 mmol/L — ABNORMAL LOW (ref 3.5–5.1)
Sodium: 136 mmol/L (ref 135–145)

## 2020-01-13 LAB — POTASSIUM: Potassium: 4 mmol/L (ref 3.5–5.1)

## 2020-01-13 MED ORDER — CYCLOBENZAPRINE HCL 5 MG PO TABS: 5.0000 mg | ORAL_TABLET | Freq: Three times a day (TID) | ORAL | 0 refills | Status: AC

## 2020-01-13 MED ORDER — DESIPRAMINE HCL 25 MG PO TABS: 25.0000 mg | ORAL_TABLET | Freq: Every day | ORAL | 0 refills | Status: AC

## 2020-01-13 MED ORDER — POTASSIUM CHLORIDE CRYS ER 20 MEQ PO TBCR
40.0000 meq | EXTENDED_RELEASE_TABLET | Freq: Once | ORAL | Status: AC
  Administered 2020-01-13: 40 meq via ORAL
  Filled 2020-01-13: qty 2

## 2020-01-13 NOTE — Discharge Summary (Signed)
Physician Discharge Summary  Deborah Griffin:[id number] DOB: 1961/03/19 DOA: 01/08/2020  PCP: Armando Gang, FNP  Admit date: 01/08/2020 Discharge date: 01/13/2020  Discharge disposition: Home   Recommendations for Outpatient Follow-Up:   Follow-up with PCP in 1 to 2 weeks   Discharge Diagnosis:   Principal Problem:   Acute colitis Active Problems:   Anxiety   Low back pain with sciatica   GERD (gastroesophageal reflux disease)   Essential hypertension   Hyponatremia   Hypomagnesemia   Hypokalemia   Nausea and vomiting    Discharge Condition: Stable.  Diet recommendation: Low-salt diet  Code status: Full code.    Hospital Course:   Deborah Griffin is an 59 y.o. female with medical history significant ofsciatica, GERD, hypertension, anxiety disorder, recurrent abdominal pain with chronic pain syndrome who presented to the ER with 9 out of 10 central abdominal pain. Pain was sharp and occasionally colicky. It was associated with nausea and vomiting.  CT abdomen pelvis showed acute colitis.  She was treated with empiric IV antibiotics (ciprofloxacin and Flagyl).  She was also treated with IV fluids and potassium and magnesium were supplemented for hypokalemia and hypomagnesemia.  She and her father were concerned that she may have gallbladder disease.  General surgeon was consulted and she had a HIDA scan which was unremarkable.  Gastroenterologist, Dr. Servando Snare, was also consulted to assist with management.  He suggested that the patient's symptoms may be due to functional dyspepsia and he recommended Flexeril and desipramine at discharge.  The patient's condition has improved.  She has been able to tolerate a regular diet and she is deemed stable for discharge to home today.     Discharge Exam:   Vitals:   01/12/20 1942 01/13/20 0502  BP: (!) 142/86 113/76  Pulse: 86 87  Resp: 17 17  Temp: 98.2 F (36.8 C) 98 F (36.7 C)  SpO2: 97% 100%   Vitals:   01/12/20 0442 01/12/20 1257 01/12/20 1942 01/13/20 0502  BP: (!) 141/83 133/84 (!) 142/86 113/76  Pulse: 93 94 86 87  Resp: 17 16 17 17   Temp: 98.7 F (37.1 C) 98.4 F (36.9 C) 98.2 F (36.8 C) 98 F (36.7 C)  TempSrc: Oral Oral Oral Oral  SpO2: 99% 97% 97% 100%  Weight:      Height:         GEN: NAD, cachetic SKIN: No rash EYES: EOMI ENT: MMM CV: RRR PULM: CTA B ABD: soft, ND, NT, +BS CNS: AAO x 3, non focal EXT: No edema or tenderness   The results of significant diagnostics from this hospitalization (including imaging, microbiology, ancillary and laboratory) are listed below for reference.     Procedures and Diagnostic Studies:   CT ABDOMEN PELVIS W CONTRAST  Addendum Date: 01/08/2020   ADDENDUM REPORT: 01/08/2020 23:13 ADDENDUM: Mild emphysema at the lung bases. Electronically Signed   By: 01/10/2020 M.D.   On: 01/08/2020 23:13   Result Date: 01/08/2020 CLINICAL DATA:  Right upper quadrant pain EXAM: CT ABDOMEN AND PELVIS WITH CONTRAST TECHNIQUE: Multidetector CT imaging of the abdomen and pelvis was performed using the standard protocol following bolus administration of intravenous contrast. CONTRAST:  74mL OMNIPAQUE IOHEXOL 300 MG/ML  SOLN COMPARISON:  CT 01/29/2006 FINDINGS: Lower chest: No acute abnormality. Hepatobiliary: No focal liver abnormality is seen. No gallbladder wall thickening or biliary dilatation. Possible small stones at the gallbladder fundus. Pancreas: Unremarkable. No pancreatic ductal dilatation or surrounding inflammatory changes. Spleen: Normal in  size without focal abnormality. Adrenals/Urinary Tract: Adrenal glands are unremarkable. Kidneys are normal, without renal calculi, focal lesion, or hydronephrosis. Bladder is unremarkable. Stomach/Bowel: The stomach is nonenlarged. No dilated small bowel. Possible diffuse colon wall thickening. Negative appendix. Vascular/Lymphatic: Moderate aortic atherosclerosis without aneurysm. No significantly  enlarged lymph nodes Reproductive: Uterus and bilateral adnexa are unremarkable. Other: Negative for free air or free fluid Musculoskeletal: No acute or significant osseous findings. IMPRESSION: 1. Collapsed appearance versus diffuse colitis type changes of the colon. 2. Possible small stones at the gallbladder fundus. 3. Otherwise no CT evidence for acute intra-abdominal or pelvic abnormality. Electronically Signed: By: Donavan Foil M.D. On: 01/08/2020 22:40   US ABDOMEN LIMITED RUQ  Result Date: 01/08/2020 CLINICAL DATA:  Right upper quadrant abdominal pain. EXAM: ULTRASOUND ABDOMEN LIMITED RIGHT UPPER QUADRANT COMPARISON:  CT from same day. FINDINGS: Gallbladder: No gallstones or wall thickening visualized. No sonographic Murphy sign noted by sonographer. Common bile duct: Diameter: 2 mm Liver: No focal lesion identified. Within normal limits in parenchymal echogenicity. Portal vein is patent on color Doppler imaging with normal direction of blood flow towards the liver. Other: None. IMPRESSION: No abnormality detected. No explanation for the patient's right upper quadrant abdominal pain. Electronically Signed   By: Constance Holster M.D.   On: 01/08/2020 22:40     Labs:   Basic Metabolic Panel: Recent Labs  Lab 01/09/20 0619 01/09/20 0619 01/10/20 0830 01/10/20 0830 01/11/20 0327 01/11/20 0327 01/12/20 0436 01/12/20 0436 01/13/20 0517 01/13/20 1449  NA 136  --  131*  --  136  --  134*  --  136  --   K 4.0   < > 3.2*   < > 3.7   < > 3.7   < > 3.3* 4.0  CL 102  --  96*  --  105  --  99  --  106  --   CO2 23  --  25  --  23  --  23  --  24  --   GLUCOSE 141*  --  97  --  109*  --  110*  --  97  --   BUN 5*  --  5*  --  5*  --  <5*  --  <5*  --   CREATININE 0.51  --  0.55  --  0.54  --  0.49  --  0.57  --   CALCIUM 9.1  --  8.7*  --  8.3*  --  8.4*  --  8.3*  --   MG  --   --  1.6*  --  2.0  --   --   --   --   --   PHOS  --   --  3.6  --   --   --   --   --   --   --    < > =  values in this interval not displayed.   GFR Estimated Creatinine Clearance: 52.7 mL/min (by C-G formula based on SCr of 0.57 mg/dL). Liver Function Tests: Recent Labs  Lab 01/08/20 2115 01/09/20 0619  AST 18 17  ALT 9 9  ALKPHOS 118 109  BILITOT 0.7 0.6  PROT 7.1 6.7  ALBUMIN 3.9 3.6   Recent Labs  Lab 01/08/20 2115  LIPASE 33   No results for input(s): AMMONIA in the last 168 hours. Coagulation profile No results for input(s): INR, PROTIME in the last 168 hours.  CBC: Recent Labs  Lab  01/08/20 2115 01/09/20 0619 01/10/20 0830 01/11/20 0327 01/12/20 0436  WBC 9.2 15.0* 14.1* 11.2* 8.2  NEUTROABS 7.1  --  11.2* 8.8* 4.8  HGB 12.4 11.6* 12.5 12.0 11.9*  HCT 36.6 34.6* 37.0 35.3* 35.4*  MCV 82.2 84.0 82.2 82.9 82.5  PLT 719* 680* 600* 545* 471*   Cardiac Enzymes: No results for input(s): CKTOTAL, CKMB, CKMBINDEX, TROPONINI in the last 168 hours. BNP: Invalid input(s): POCBNP CBG: No results for input(s): GLUCAP in the last 168 hours. D-Dimer No results for input(s): DDIMER in the last 72 hours. Hgb A1c No results for input(s): HGBA1C in the last 72 hours. Lipid Profile No results for input(s): CHOL, HDL, LDLCALC, TRIG, CHOLHDL, LDLDIRECT in the last 72 hours. Thyroid function studies No results for input(s): TSH, T4TOTAL, T3FREE, THYROIDAB in the last 72 hours.  Invalid input(s): FREET3 Anemia work up No results for input(s): VITAMINB12, FOLATE, FERRITIN, TIBC, IRON, RETICCTPCT in the last 72 hours. Microbiology Recent Results (from the past 240 hour(s))  SARS CORONAVIRUS 2 (TAT 6-24 HRS) Nasopharyngeal Nasopharyngeal Swab     Status: None   Collection Time: 01/09/20  1:03 AM   Specimen: Nasopharyngeal Swab  Result Value Ref Range Status   SARS Coronavirus 2 NEGATIVE NEGATIVE Final    Comment: (NOTE) SARS-CoV-2 target nucleic acids are NOT DETECTED. The SARS-CoV-2 RNA is generally detectable in upper and lower respiratory specimens during the acute  phase of infection. Negative results do not preclude SARS-CoV-2 infection, do not rule out co-infections with other pathogens, and should not be used as the sole basis for treatment or other patient management decisions. Negative results must be combined with clinical observations, patient history, and epidemiological information. The expected result is Negative. Fact Sheet for Patients: [employer]no Fact Sheet for Healthcare Providers: quierodirigir.com This test is not yet approved or cleared by the [place] FDA and  has been authorized for detection and/or diagnosis of SARS-CoV-2 by FDA under an Emergency Use Authorization (EUA). This EUA will remain  in effect (meaning this test can be used) for the duration of the COVID-19 declaration under Section 56 4(b)(1) of the Act, 21 U.S.C. section 360bbb-3(b)(1), unless the authorization is terminated or revoked sooner. Performed at [facility], [address] [place], [place] [address]      Discharge Instructions:   Discharge Instructions    Diet - low sodium heart healthy   Complete by: As directed    Increase activity slowly   Complete by: As directed      Allergies as of 01/13/2020      Reactions   Penicillins    Did it involve swelling of the face/tongue/throat, SOB, or low BP? Yes Did it involve sudden or severe rash/hives, skin peeling, or any reaction on the inside of your mouth or nose? Yes Did you need to seek medical attention at a hospital or doctor's office? Yes When did it last happen?Unknown If all above answers are "NO", may proceed with cephalosporin use.      Medication List    STOP taking these medications   naproxen 500 MG tablet Commonly known as: NAPROSYN   traMADol 50 MG tablet Commonly known as: ULTRAM     TAKE these medications   ALPRAZolam 0.5 MG tablet Commonly known as: XANAX Take 1 tablet (0.5 mg total) by mouth 2  (two) times daily.   cyclobenzaprine 5 MG tablet Commonly known as: FLEXERIL Take 1 tablet (5 mg total) by mouth 3 (three) times daily.  desipramine 25 MG tablet Commonly known as: NORPRAMIN Take 1 tablet (25 mg total) by mouth at bedtime.   escitalopram 5 MG tablet Commonly known as: LEXAPRO Take 5 mg by mouth daily.   lisinopril 20 MG tablet Commonly known as: ZESTRIL Take 20 mg by mouth daily.   pantoprazole 40 MG tablet Commonly known as: PROTONIX Take 40 mg by mouth 2 (two) times daily.         Time coordinating discharge: 26 minutes  Signed:  Nevaya Nagele  [facility] 01/13/2020, 3:15 PM

## 2020-01-13 NOTE — Progress Notes (Signed)
Midge Minium, MD [facility]   [address] [place], [place] [address] Phone: [phone] Fax : [phone]   Subjective: The patient reports that she is feeling much better today.  The patient was started on abdominal wall pain and desipramine for functional dyspepsia.  The patient tolerated her breakfast this morning   Objective: Vital signs in last 24 hours: Vitals:   01/12/20 0442 01/12/20 1257 01/12/20 1942 01/13/20 0502  BP: (!) 141/83 133/84 (!) 142/86 113/76  Pulse: 93 94 86 87  Resp: 17 16 17 17   Temp: 98.7 F (37.1 C) 98.4 F (36.9 C) 98.2 F (36.8 C) 98 F (36.7 C)  TempSrc: Oral Oral Oral Oral  SpO2: 99% 97% 97% 100%  Weight:      Height:       Weight change:   Intake/Output Summary (Last 24 hours) at 01/13/2020 1250 Last data filed at 01/13/2020 1000 Gross per 24 hour  Intake 1252.49 ml  Output --  Net 1252.49 ml     Exam: General: Patient sitting up in bed in no apparent distress Skin without any rashes or lesions   Lab Results: @LABTEST2 @ Micro Results: Recent Results (from the past 240 hour(s))  SARS CORONAVIRUS 2 (TAT 6-24 HRS) Nasopharyngeal Nasopharyngeal Swab     Status: None   Collection Time: 01/09/20  1:03 AM   Specimen: Nasopharyngeal Swab  Result Value Ref Range Status   SARS Coronavirus 2 NEGATIVE NEGATIVE Final    Comment: (NOTE) SARS-CoV-2 target nucleic acids are NOT DETECTED. The SARS-CoV-2 RNA is generally detectable in upper and lower respiratory specimens during the acute phase of infection. Negative results do not preclude SARS-CoV-2 infection, do not rule out co-infections with other pathogens, and should not be used as the sole basis for treatment or other patient management decisions. Negative results must be combined with clinical observations, patient history, and epidemiological information. The expected result is Negative. Fact Sheet for Patients: Fact Sheet for  Healthcare Providers: 01/11/20 This test is not yet approved or cleared by the [employer] FDA and  has been authorized for detection and/or diagnosis of SARS-CoV-2 by FDA under an Emergency Use Authorization (EUA). This EUA will remain  in effect (meaning this test can be used) for the duration of the COVID-19 declaration under Section 56 4(b)(1) of the Act, 21 U.S.C. section 360bbb-3(b)(1), unless the authorization is terminated or revoked sooner. Performed at [facility], [address], [place], [address] [place]    Studies/Results: NM Hepato W/EF  Result Date: 01/11/2020 CLINICAL DATA:  Abdominal pain for the past 2 years. Nausea and vomiting. EXAM: NUCLEAR MEDICINE HEPATOBILIARY IMAGING WITH GALLBLADDER EF TECHNIQUE: Sequential images of the abdomen were obtained out to 60 minutes following intravenous administration of radiopharmaceutical. After slow intravenous infusion of 0.86 micrograms Cholecystokinin, gallbladder ejection fraction was determined. RADIOPHARMACEUTICALS:  5.37 mCi Tc-61m Choletec IV COMPARISON:  CT, 01/08/2020 FINDINGS: Prompt uptake and biliary excretion of activity by the liver is seen. Gallbladder activity is visualized, consistent with patency of cystic duct. Biliary activity passes into small bowel, consistent with patent common bile duct. Calculated gallbladder ejection fraction is 83%. (At 60 min, normal ejection fraction is greater than 40%.) IMPRESSION: Normal hepatobiliary imaging with normal gallbladder function. Electronically Signed   By: 84m M.D.   On: 01/11/2020 16:50   Medications: I have reviewed the patient's current medications. Scheduled Meds: . ALPRAZolam  0.5 mg Oral BID  . cyclobenzaprine  5 mg Oral TID  . desipramine  25 mg Oral QHS  . enoxaparin (LOVENOX) injection  40 mg Subcutaneous Q24H  . feeding supplement  1 Container Oral TID BM  . lisinopril  20 mg Oral Daily  . multivitamin with  minerals  1 tablet Oral Daily  . pantoprazole  40 mg Oral BID   Continuous Infusions: . ciprofloxacin 200 mL/hr at 01/13/20 1000  . metronidazole 500 mg (01/13/20 1022)   PRN Meds:.ketorolac, morphine injection, ondansetron (ZOFRAN) IV, oxyCODONE, promethazine   Assessment: Principal Problem:   Acute colitis Active Problems:   Anxiety   Low back pain with sciatica   GERD (gastroesophageal reflux disease)   Essential hypertension   Hyponatremia   Hypomagnesemia   Hypokalemia   Nausea and vomiting    Plan: The patient was started on desipramine for functional dyspepsia and Flexeril for abdominal wall pain.  The patient should be sent home on those and should also be continued on a PPI.  The patient has been told that she can stop the Flexeril if her abdominal wall pain improves and she should continue the PPI.  As for the tricyclic antidepressant for the functional bowel, she has been told that if she decides not to take that that she should wean herself off of it and has been given instructions for that.  The patient has been followed in the past at [facility] and now does not have any insurance.  The patient may want to apply for assistance at [facility] and follow-up there or she is welcome to follow-up with me but she is concerned about not having any healthcare insurance.   LOS: 4 days   Lucilla Lame 01/13/2020, 12:50 PM Pager [phone] 7am-5pm  Check AMION for 5pm -7am coverage and on weekends

## 2020-01-13 NOTE — Plan of Care (Signed)
Discharge completed with patient who verbalized understanding of teaching, medications, and follow up information.  Patient discharged in stable condition and with all belongings.

## 2020-01-13 NOTE — Plan of Care (Signed)
Continuing with plan of care. 

## 2021-04-28 IMAGING — NM NM HEPATO W/GB/PHARM/[PERSON_NAME]
2 series · 12 of 12 positions shown · non-contrast
Comparison: CT, 01/08/2020

CLINICAL DATA: Abdominal pain for the past 2 years. Nausea and
vomiting.

EXAM:
NUCLEAR MEDICINE HEPATOBILIARY IMAGING WITH GALLBLADDER EF
TECHNIQUE: Sequential images of the abdomen were obtained [DATE] minutes
following intravenous administration of radiopharmaceutical. After
slow intravenous infusion of 0.86 micrograms Cholecystokinin,
gallbladder ejection fraction was determined.
RADIOPHARMACEUTICALS:  5.37 mCi 6c-33m Choletec IV

[Series 1000: gallbladder ef · 4.80mm/px · 6 of 120 frames shown]
[frame 11/120]
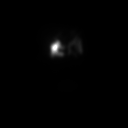
[frame 31/120]
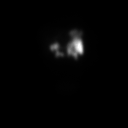
[frame 51/120]
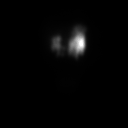
[frame 71/120]
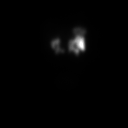
[frame 91/120]
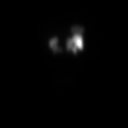
[frame 111/120]
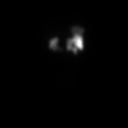

[Series 1000: hepatobiliary scan · 9.59mm/px · 6 of 60 frames shown]
[frame 6/60]
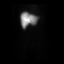
[frame 16/60]
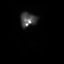
[frame 26/60]
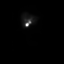
[frame 36/60]
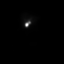
[frame 46/60]
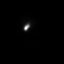
[frame 56/60]
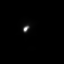

[12 of 12 positions shown; findings below may reference images not displayed]

FINDINGS: Prompt uptake and biliary excretion of activity by the liver is
seen. Gallbladder activity is visualized, consistent with patency of
cystic duct. Biliary activity passes into small bowel, consistent
with patent common bile duct.

Calculated gallbladder ejection fraction is 83%. (At 60 min, normal
ejection fraction is greater than 40%.)
IMPRESSION: Normal hepatobiliary imaging with normal gallbladder function.
